# Patient Record
Sex: Female | Born: 1945 | Race: Black or African American | Hispanic: No | Smoking: Former smoker
Health system: Southern US, Community
[De-identification: ages and names within clinical notes are randomized; demographics above are authoritative.]

## PROBLEM LIST (undated history)

## (undated) DIAGNOSIS — E079 Disorder of thyroid, unspecified: Secondary | ICD-10-CM

## (undated) DIAGNOSIS — N189 Chronic kidney disease, unspecified: Secondary | ICD-10-CM

## (undated) DIAGNOSIS — A6000 Herpesviral infection of urogenital system, unspecified: Secondary | ICD-10-CM

## (undated) DIAGNOSIS — I10 Essential (primary) hypertension: Secondary | ICD-10-CM

## (undated) DIAGNOSIS — F32A Depression, unspecified: Secondary | ICD-10-CM

## (undated) DIAGNOSIS — E119 Type 2 diabetes mellitus without complications: Secondary | ICD-10-CM

## (undated) DIAGNOSIS — K219 Gastro-esophageal reflux disease without esophagitis: Secondary | ICD-10-CM

## (undated) DIAGNOSIS — E785 Hyperlipidemia, unspecified: Secondary | ICD-10-CM

## (undated) DIAGNOSIS — R011 Cardiac murmur, unspecified: Secondary | ICD-10-CM

## (undated) HISTORY — DX: Cardiac murmur, unspecified: R01.1

## (undated) HISTORY — DX: Depression, unspecified: F32.A

## (undated) HISTORY — DX: Disorder of thyroid, unspecified: E07.9

## (undated) HISTORY — DX: Hyperlipidemia, unspecified: E78.5

## (undated) HISTORY — DX: Herpesviral infection of urogenital system, unspecified: A60.00

## (undated) HISTORY — DX: Chronic kidney disease, unspecified: N18.9

## (undated) HISTORY — DX: Essential (primary) hypertension: I10

## (undated) HISTORY — DX: Gastro-esophageal reflux disease without esophagitis: K21.9

## (undated) HISTORY — DX: Type 2 diabetes mellitus without complications: E11.9

---

## 2016-07-07 DIAGNOSIS — Z1231 Encounter for screening mammogram for malignant neoplasm of breast: Secondary | ICD-10-CM | POA: Diagnosis not present

## 2016-08-02 DIAGNOSIS — E782 Mixed hyperlipidemia: Secondary | ICD-10-CM | POA: Diagnosis not present

## 2016-08-02 DIAGNOSIS — E119 Type 2 diabetes mellitus without complications: Secondary | ICD-10-CM | POA: Diagnosis not present

## 2016-08-02 DIAGNOSIS — E039 Hypothyroidism, unspecified: Secondary | ICD-10-CM | POA: Diagnosis not present

## 2016-08-02 DIAGNOSIS — I10 Essential (primary) hypertension: Secondary | ICD-10-CM | POA: Diagnosis not present

## 2016-09-02 DIAGNOSIS — I10 Essential (primary) hypertension: Secondary | ICD-10-CM | POA: Diagnosis not present

## 2016-09-29 DIAGNOSIS — H401132 Primary open-angle glaucoma, bilateral, moderate stage: Secondary | ICD-10-CM | POA: Diagnosis not present

## 2016-09-29 DIAGNOSIS — E119 Type 2 diabetes mellitus without complications: Secondary | ICD-10-CM | POA: Diagnosis not present

## 2017-01-03 DIAGNOSIS — N390 Urinary tract infection, site not specified: Secondary | ICD-10-CM | POA: Diagnosis not present

## 2017-01-03 DIAGNOSIS — N189 Chronic kidney disease, unspecified: Secondary | ICD-10-CM | POA: Diagnosis not present

## 2017-01-03 DIAGNOSIS — I10 Essential (primary) hypertension: Secondary | ICD-10-CM | POA: Diagnosis not present

## 2017-01-03 DIAGNOSIS — M549 Dorsalgia, unspecified: Secondary | ICD-10-CM | POA: Diagnosis not present

## 2017-02-03 DIAGNOSIS — N189 Chronic kidney disease, unspecified: Secondary | ICD-10-CM | POA: Diagnosis not present

## 2017-02-03 DIAGNOSIS — Z79899 Other long term (current) drug therapy: Secondary | ICD-10-CM | POA: Diagnosis not present

## 2017-02-03 DIAGNOSIS — Z23 Encounter for immunization: Secondary | ICD-10-CM | POA: Diagnosis not present

## 2017-02-03 DIAGNOSIS — Z131 Encounter for screening for diabetes mellitus: Secondary | ICD-10-CM | POA: Diagnosis not present

## 2017-02-03 DIAGNOSIS — Z0000 Encounter for general adult medical examination without abnormal findings: Secondary | ICD-10-CM | POA: Diagnosis not present

## 2017-02-03 DIAGNOSIS — E039 Hypothyroidism, unspecified: Secondary | ICD-10-CM | POA: Diagnosis not present

## 2017-02-03 DIAGNOSIS — E782 Mixed hyperlipidemia: Secondary | ICD-10-CM | POA: Diagnosis not present

## 2017-02-03 DIAGNOSIS — E119 Type 2 diabetes mellitus without complications: Secondary | ICD-10-CM | POA: Diagnosis not present

## 2017-02-03 DIAGNOSIS — I10 Essential (primary) hypertension: Secondary | ICD-10-CM | POA: Diagnosis not present

## 2017-04-04 DIAGNOSIS — H04122 Dry eye syndrome of left lacrimal gland: Secondary | ICD-10-CM | POA: Diagnosis not present

## 2017-04-04 DIAGNOSIS — H401132 Primary open-angle glaucoma, bilateral, moderate stage: Secondary | ICD-10-CM | POA: Diagnosis not present

## 2017-04-04 DIAGNOSIS — H04121 Dry eye syndrome of right lacrimal gland: Secondary | ICD-10-CM | POA: Diagnosis not present

## 2017-04-04 DIAGNOSIS — E119 Type 2 diabetes mellitus without complications: Secondary | ICD-10-CM | POA: Diagnosis not present

## 2017-04-04 DIAGNOSIS — H2513 Age-related nuclear cataract, bilateral: Secondary | ICD-10-CM | POA: Diagnosis not present

## 2017-04-04 DIAGNOSIS — H04123 Dry eye syndrome of bilateral lacrimal glands: Secondary | ICD-10-CM | POA: Diagnosis not present

## 2017-04-10 DIAGNOSIS — I10 Essential (primary) hypertension: Secondary | ICD-10-CM | POA: Diagnosis not present

## 2017-04-10 DIAGNOSIS — N189 Chronic kidney disease, unspecified: Secondary | ICD-10-CM | POA: Diagnosis not present

## 2017-04-10 DIAGNOSIS — E669 Obesity, unspecified: Secondary | ICD-10-CM | POA: Diagnosis not present

## 2017-04-10 DIAGNOSIS — G4733 Obstructive sleep apnea (adult) (pediatric): Secondary | ICD-10-CM | POA: Diagnosis not present

## 2017-07-10 DIAGNOSIS — Z1231 Encounter for screening mammogram for malignant neoplasm of breast: Secondary | ICD-10-CM | POA: Diagnosis not present

## 2017-08-02 DIAGNOSIS — E119 Type 2 diabetes mellitus without complications: Secondary | ICD-10-CM | POA: Diagnosis not present

## 2017-08-02 DIAGNOSIS — E039 Hypothyroidism, unspecified: Secondary | ICD-10-CM | POA: Diagnosis not present

## 2017-08-02 DIAGNOSIS — E782 Mixed hyperlipidemia: Secondary | ICD-10-CM | POA: Diagnosis not present

## 2017-08-02 DIAGNOSIS — I10 Essential (primary) hypertension: Secondary | ICD-10-CM | POA: Diagnosis not present

## 2017-10-03 DIAGNOSIS — H04121 Dry eye syndrome of right lacrimal gland: Secondary | ICD-10-CM | POA: Diagnosis not present

## 2017-10-03 DIAGNOSIS — E119 Type 2 diabetes mellitus without complications: Secondary | ICD-10-CM | POA: Diagnosis not present

## 2017-10-03 DIAGNOSIS — H04122 Dry eye syndrome of left lacrimal gland: Secondary | ICD-10-CM | POA: Diagnosis not present

## 2017-10-03 DIAGNOSIS — H2513 Age-related nuclear cataract, bilateral: Secondary | ICD-10-CM | POA: Diagnosis not present

## 2017-10-03 DIAGNOSIS — H04123 Dry eye syndrome of bilateral lacrimal glands: Secondary | ICD-10-CM | POA: Diagnosis not present

## 2017-10-03 DIAGNOSIS — H35033 Hypertensive retinopathy, bilateral: Secondary | ICD-10-CM | POA: Diagnosis not present

## 2017-10-03 DIAGNOSIS — H401132 Primary open-angle glaucoma, bilateral, moderate stage: Secondary | ICD-10-CM | POA: Diagnosis not present

## 2017-11-06 DIAGNOSIS — I10 Essential (primary) hypertension: Secondary | ICD-10-CM | POA: Diagnosis not present

## 2018-01-24 DIAGNOSIS — N183 Chronic kidney disease, stage 3 (moderate): Secondary | ICD-10-CM | POA: Diagnosis not present

## 2018-01-24 DIAGNOSIS — E785 Hyperlipidemia, unspecified: Secondary | ICD-10-CM | POA: Diagnosis not present

## 2018-01-24 DIAGNOSIS — Z23 Encounter for immunization: Secondary | ICD-10-CM | POA: Diagnosis not present

## 2018-01-24 DIAGNOSIS — E1169 Type 2 diabetes mellitus with other specified complication: Secondary | ICD-10-CM | POA: Diagnosis not present

## 2018-01-24 DIAGNOSIS — I10 Essential (primary) hypertension: Secondary | ICD-10-CM | POA: Diagnosis not present

## 2018-01-24 DIAGNOSIS — A6004 Herpesviral vulvovaginitis: Secondary | ICD-10-CM | POA: Diagnosis not present

## 2018-03-02 DIAGNOSIS — E785 Hyperlipidemia, unspecified: Secondary | ICD-10-CM | POA: Diagnosis not present

## 2018-03-02 DIAGNOSIS — I10 Essential (primary) hypertension: Secondary | ICD-10-CM | POA: Diagnosis not present

## 2018-03-02 DIAGNOSIS — N183 Chronic kidney disease, stage 3 (moderate): Secondary | ICD-10-CM | POA: Diagnosis not present

## 2018-03-02 DIAGNOSIS — Z780 Asymptomatic menopausal state: Secondary | ICD-10-CM | POA: Diagnosis not present

## 2018-03-02 DIAGNOSIS — L84 Corns and callosities: Secondary | ICD-10-CM | POA: Diagnosis not present

## 2018-03-02 DIAGNOSIS — Z0000 Encounter for general adult medical examination without abnormal findings: Secondary | ICD-10-CM | POA: Diagnosis not present

## 2018-03-02 DIAGNOSIS — Z1389 Encounter for screening for other disorder: Secondary | ICD-10-CM | POA: Diagnosis not present

## 2018-03-02 DIAGNOSIS — E1169 Type 2 diabetes mellitus with other specified complication: Secondary | ICD-10-CM | POA: Diagnosis not present

## 2018-04-23 DIAGNOSIS — G4733 Obstructive sleep apnea (adult) (pediatric): Secondary | ICD-10-CM | POA: Diagnosis not present

## 2018-04-27 DIAGNOSIS — E119 Type 2 diabetes mellitus without complications: Secondary | ICD-10-CM | POA: Diagnosis not present

## 2018-04-27 DIAGNOSIS — H524 Presbyopia: Secondary | ICD-10-CM | POA: Diagnosis not present

## 2018-06-08 DIAGNOSIS — L02416 Cutaneous abscess of left lower limb: Secondary | ICD-10-CM | POA: Diagnosis not present

## 2018-06-08 DIAGNOSIS — Z780 Asymptomatic menopausal state: Secondary | ICD-10-CM | POA: Diagnosis not present

## 2018-06-08 DIAGNOSIS — G4733 Obstructive sleep apnea (adult) (pediatric): Secondary | ICD-10-CM | POA: Diagnosis not present

## 2018-06-08 DIAGNOSIS — I10 Essential (primary) hypertension: Secondary | ICD-10-CM | POA: Diagnosis not present

## 2018-06-08 DIAGNOSIS — E1169 Type 2 diabetes mellitus with other specified complication: Secondary | ICD-10-CM | POA: Diagnosis not present

## 2018-06-08 DIAGNOSIS — L989 Disorder of the skin and subcutaneous tissue, unspecified: Secondary | ICD-10-CM | POA: Diagnosis not present

## 2018-06-08 DIAGNOSIS — Z1239 Encounter for other screening for malignant neoplasm of breast: Secondary | ICD-10-CM | POA: Diagnosis not present

## 2018-06-11 ENCOUNTER — Other Ambulatory Visit: Payer: Self-pay | Admitting: Internal Medicine

## 2018-06-11 DIAGNOSIS — Z1231 Encounter for screening mammogram for malignant neoplasm of breast: Secondary | ICD-10-CM

## 2018-07-13 ENCOUNTER — Inpatient Hospital Stay: Admission: RE | Admit: 2018-07-13 | Payer: Self-pay | Source: Ambulatory Visit

## 2018-07-16 DIAGNOSIS — E1169 Type 2 diabetes mellitus with other specified complication: Secondary | ICD-10-CM | POA: Diagnosis not present

## 2018-07-16 DIAGNOSIS — L237 Allergic contact dermatitis due to plants, except food: Secondary | ICD-10-CM | POA: Diagnosis not present

## 2018-07-16 DIAGNOSIS — I10 Essential (primary) hypertension: Secondary | ICD-10-CM | POA: Diagnosis not present

## 2018-08-14 ENCOUNTER — Ambulatory Visit: Payer: Self-pay

## 2018-09-11 DIAGNOSIS — N183 Chronic kidney disease, stage 3 (moderate): Secondary | ICD-10-CM | POA: Diagnosis not present

## 2018-09-11 DIAGNOSIS — I10 Essential (primary) hypertension: Secondary | ICD-10-CM | POA: Diagnosis not present

## 2018-09-11 DIAGNOSIS — E785 Hyperlipidemia, unspecified: Secondary | ICD-10-CM | POA: Diagnosis not present

## 2018-09-11 DIAGNOSIS — E1169 Type 2 diabetes mellitus with other specified complication: Secondary | ICD-10-CM | POA: Diagnosis not present

## 2018-09-20 DIAGNOSIS — E1169 Type 2 diabetes mellitus with other specified complication: Secondary | ICD-10-CM | POA: Diagnosis not present

## 2018-09-20 DIAGNOSIS — E785 Hyperlipidemia, unspecified: Secondary | ICD-10-CM | POA: Diagnosis not present

## 2018-09-20 DIAGNOSIS — A6004 Herpesviral vulvovaginitis: Secondary | ICD-10-CM | POA: Diagnosis not present

## 2018-09-20 DIAGNOSIS — N183 Chronic kidney disease, stage 3 (moderate): Secondary | ICD-10-CM | POA: Diagnosis not present

## 2018-09-20 DIAGNOSIS — I10 Essential (primary) hypertension: Secondary | ICD-10-CM | POA: Diagnosis not present

## 2018-09-26 ENCOUNTER — Ambulatory Visit: Payer: Self-pay

## 2018-10-05 DIAGNOSIS — W57XXXA Bitten or stung by nonvenomous insect and other nonvenomous arthropods, initial encounter: Secondary | ICD-10-CM | POA: Diagnosis not present

## 2018-10-05 DIAGNOSIS — S2096XA Insect bite (nonvenomous) of unspecified parts of thorax, initial encounter: Secondary | ICD-10-CM | POA: Diagnosis not present

## 2018-10-05 DIAGNOSIS — L209 Atopic dermatitis, unspecified: Secondary | ICD-10-CM | POA: Diagnosis not present

## 2018-10-08 DIAGNOSIS — E1169 Type 2 diabetes mellitus with other specified complication: Secondary | ICD-10-CM | POA: Diagnosis not present

## 2018-10-08 DIAGNOSIS — N183 Chronic kidney disease, stage 3 (moderate): Secondary | ICD-10-CM | POA: Diagnosis not present

## 2018-10-08 DIAGNOSIS — I10 Essential (primary) hypertension: Secondary | ICD-10-CM | POA: Diagnosis not present

## 2018-10-08 DIAGNOSIS — E785 Hyperlipidemia, unspecified: Secondary | ICD-10-CM | POA: Diagnosis not present

## 2018-10-08 DIAGNOSIS — E039 Hypothyroidism, unspecified: Secondary | ICD-10-CM | POA: Diagnosis not present

## 2018-11-05 ENCOUNTER — Ambulatory Visit
Admission: RE | Admit: 2018-11-05 | Discharge: 2018-11-05 | Disposition: A | Discharge status: Home / Self Care | Payer: Medicare Other | Source: Ambulatory Visit | Attending: Internal Medicine | Admitting: Internal Medicine

## 2018-11-05 ENCOUNTER — Other Ambulatory Visit: Payer: Self-pay

## 2018-11-05 DIAGNOSIS — Z1231 Encounter for screening mammogram for malignant neoplasm of breast: Secondary | ICD-10-CM | POA: Diagnosis not present

## 2018-11-23 DIAGNOSIS — E1169 Type 2 diabetes mellitus with other specified complication: Secondary | ICD-10-CM | POA: Diagnosis not present

## 2018-11-23 DIAGNOSIS — I10 Essential (primary) hypertension: Secondary | ICD-10-CM | POA: Diagnosis not present

## 2018-11-23 DIAGNOSIS — E039 Hypothyroidism, unspecified: Secondary | ICD-10-CM | POA: Diagnosis not present

## 2018-11-23 DIAGNOSIS — Z7984 Long term (current) use of oral hypoglycemic drugs: Secondary | ICD-10-CM | POA: Diagnosis not present

## 2018-11-23 DIAGNOSIS — E785 Hyperlipidemia, unspecified: Secondary | ICD-10-CM | POA: Diagnosis not present

## 2018-11-23 DIAGNOSIS — N183 Chronic kidney disease, stage 3 (moderate): Secondary | ICD-10-CM | POA: Diagnosis not present

## 2019-01-03 DIAGNOSIS — E1169 Type 2 diabetes mellitus with other specified complication: Secondary | ICD-10-CM | POA: Diagnosis not present

## 2019-01-03 DIAGNOSIS — E785 Hyperlipidemia, unspecified: Secondary | ICD-10-CM | POA: Diagnosis not present

## 2019-01-03 DIAGNOSIS — E039 Hypothyroidism, unspecified: Secondary | ICD-10-CM | POA: Diagnosis not present

## 2019-01-03 DIAGNOSIS — I10 Essential (primary) hypertension: Secondary | ICD-10-CM | POA: Diagnosis not present

## 2019-01-03 DIAGNOSIS — N183 Chronic kidney disease, stage 3 (moderate): Secondary | ICD-10-CM | POA: Diagnosis not present

## 2019-01-29 DIAGNOSIS — I10 Essential (primary) hypertension: Secondary | ICD-10-CM | POA: Diagnosis not present

## 2019-01-29 DIAGNOSIS — E039 Hypothyroidism, unspecified: Secondary | ICD-10-CM | POA: Diagnosis not present

## 2019-01-29 DIAGNOSIS — E1169 Type 2 diabetes mellitus with other specified complication: Secondary | ICD-10-CM | POA: Diagnosis not present

## 2019-01-29 DIAGNOSIS — E785 Hyperlipidemia, unspecified: Secondary | ICD-10-CM | POA: Diagnosis not present

## 2019-02-22 DIAGNOSIS — Z23 Encounter for immunization: Secondary | ICD-10-CM | POA: Diagnosis not present

## 2019-03-15 ENCOUNTER — Other Ambulatory Visit: Payer: Self-pay | Admitting: Internal Medicine

## 2019-03-15 DIAGNOSIS — E785 Hyperlipidemia, unspecified: Secondary | ICD-10-CM | POA: Diagnosis not present

## 2019-03-15 DIAGNOSIS — E2839 Other primary ovarian failure: Secondary | ICD-10-CM

## 2019-03-15 DIAGNOSIS — Z7984 Long term (current) use of oral hypoglycemic drugs: Secondary | ICD-10-CM | POA: Diagnosis not present

## 2019-03-15 DIAGNOSIS — Z23 Encounter for immunization: Secondary | ICD-10-CM | POA: Diagnosis not present

## 2019-03-15 DIAGNOSIS — Z1389 Encounter for screening for other disorder: Secondary | ICD-10-CM | POA: Diagnosis not present

## 2019-03-15 DIAGNOSIS — N1832 Chronic kidney disease, stage 3b: Secondary | ICD-10-CM | POA: Diagnosis not present

## 2019-03-15 DIAGNOSIS — Z0000 Encounter for general adult medical examination without abnormal findings: Secondary | ICD-10-CM | POA: Diagnosis not present

## 2019-03-15 DIAGNOSIS — E039 Hypothyroidism, unspecified: Secondary | ICD-10-CM | POA: Diagnosis not present

## 2019-03-15 DIAGNOSIS — Z780 Asymptomatic menopausal state: Secondary | ICD-10-CM | POA: Diagnosis not present

## 2019-03-15 DIAGNOSIS — E1169 Type 2 diabetes mellitus with other specified complication: Secondary | ICD-10-CM | POA: Diagnosis not present

## 2019-03-15 DIAGNOSIS — I10 Essential (primary) hypertension: Secondary | ICD-10-CM | POA: Diagnosis not present

## 2019-04-03 ENCOUNTER — Other Ambulatory Visit: Payer: Self-pay

## 2019-04-03 ENCOUNTER — Ambulatory Visit
Admission: RE | Admit: 2019-04-03 | Discharge: 2019-04-03 | Disposition: A | Discharge status: Home / Self Care | Payer: Medicare Other | Source: Ambulatory Visit | Attending: Internal Medicine | Admitting: Internal Medicine

## 2019-04-03 DIAGNOSIS — Z780 Asymptomatic menopausal state: Secondary | ICD-10-CM | POA: Diagnosis not present

## 2019-04-03 DIAGNOSIS — E2839 Other primary ovarian failure: Secondary | ICD-10-CM

## 2019-04-03 DIAGNOSIS — Z13820 Encounter for screening for osteoporosis: Secondary | ICD-10-CM | POA: Diagnosis not present

## 2019-05-22 ENCOUNTER — Ambulatory Visit (INDEPENDENT_AMBULATORY_CARE_PROVIDER_SITE_OTHER): Payer: Medicare Other | Admitting: Podiatry

## 2019-05-22 ENCOUNTER — Other Ambulatory Visit: Payer: Self-pay

## 2019-05-22 ENCOUNTER — Encounter: Payer: Self-pay | Admitting: Podiatry

## 2019-05-22 DIAGNOSIS — L84 Corns and callosities: Secondary | ICD-10-CM | POA: Diagnosis not present

## 2019-05-22 NOTE — Progress Notes (Signed)
This patient presents the office with chief complaint of painful calluses on both feet.  She says she is most concerned about the callus under her left big toe.  She says she also has had calluses on the outside ball of both feet for years.  She says she has utilize acid to treat the calluses on the outside bottom of both feet.  This patient is diabetic and presents the office for treatment of the callus and a diabetic foot exam.  Vascular  Dorsalis pedis and posterior tibial pulses are palpable  B/L.  Capillary return  WNL.  Temperature gradient is  WNL.  Skin turgor  WNL  Sensorium  Senn Weinstein monofilament wire  WNL. Normal tactile sensation.  Nail Exam  Patient has normal nails with no evidence of bacterial or fungal infection.  Orthopedic  Exam  Muscle tone and muscle strength  WNL.  No limitations of motion feet  B/L.  No crepitus or joint effusion noted.  Foot type is unremarkable and digits show no abnormalities.  Bony prominences are unremarkable. Plantar flex fifth metatarsal  B/L.  Skin  No open lesions.  Normal skin texture and turgor.  Callus left hallux.  Asymptomatic porokeratosis sub 5th met  B/L.  Callus  Left hallux.  IE.  Debride callus left hallux.  Discussed calluses sub 5th  B/L which are asymptomatic.  Patient has no evidence of any vascular or neurologic pathology.  RTC prn.   Gardiner Barefoot DPM

## 2019-08-02 DIAGNOSIS — E1169 Type 2 diabetes mellitus with other specified complication: Secondary | ICD-10-CM | POA: Diagnosis not present

## 2019-08-02 DIAGNOSIS — N183 Chronic kidney disease, stage 3 unspecified: Secondary | ICD-10-CM | POA: Diagnosis not present

## 2019-08-02 DIAGNOSIS — M7052 Other bursitis of knee, left knee: Secondary | ICD-10-CM | POA: Diagnosis not present

## 2019-09-13 DIAGNOSIS — E1169 Type 2 diabetes mellitus with other specified complication: Secondary | ICD-10-CM | POA: Diagnosis not present

## 2019-09-13 DIAGNOSIS — I10 Essential (primary) hypertension: Secondary | ICD-10-CM | POA: Diagnosis not present

## 2019-09-13 DIAGNOSIS — N183 Chronic kidney disease, stage 3 unspecified: Secondary | ICD-10-CM | POA: Diagnosis not present

## 2019-10-08 ENCOUNTER — Other Ambulatory Visit: Payer: Self-pay | Admitting: Internal Medicine

## 2019-10-08 DIAGNOSIS — Z1231 Encounter for screening mammogram for malignant neoplasm of breast: Secondary | ICD-10-CM

## 2019-11-07 ENCOUNTER — Other Ambulatory Visit: Payer: Self-pay

## 2019-11-07 ENCOUNTER — Ambulatory Visit
Admission: RE | Admit: 2019-11-07 | Discharge: 2019-11-07 | Disposition: A | Discharge status: Home / Self Care | Payer: Medicare Other | Source: Ambulatory Visit | Attending: Internal Medicine | Admitting: Internal Medicine

## 2019-11-07 DIAGNOSIS — Z1231 Encounter for screening mammogram for malignant neoplasm of breast: Secondary | ICD-10-CM | POA: Diagnosis not present

## 2019-11-12 ENCOUNTER — Other Ambulatory Visit: Payer: Self-pay | Admitting: Internal Medicine

## 2019-11-12 DIAGNOSIS — R928 Other abnormal and inconclusive findings on diagnostic imaging of breast: Secondary | ICD-10-CM

## 2019-11-15 ENCOUNTER — Other Ambulatory Visit: Payer: Self-pay

## 2019-11-15 ENCOUNTER — Ambulatory Visit
Admission: RE | Admit: 2019-11-15 | Discharge: 2019-11-15 | Disposition: A | Discharge status: Home / Self Care | Payer: Medicare Other | Source: Ambulatory Visit | Attending: Internal Medicine | Admitting: Internal Medicine

## 2019-11-15 DIAGNOSIS — N6489 Other specified disorders of breast: Secondary | ICD-10-CM | POA: Diagnosis not present

## 2019-11-15 DIAGNOSIS — R928 Other abnormal and inconclusive findings on diagnostic imaging of breast: Secondary | ICD-10-CM | POA: Diagnosis not present

## 2020-01-29 DIAGNOSIS — Z23 Encounter for immunization: Secondary | ICD-10-CM | POA: Diagnosis not present

## 2020-03-24 DIAGNOSIS — Z7984 Long term (current) use of oral hypoglycemic drugs: Secondary | ICD-10-CM | POA: Diagnosis not present

## 2020-03-24 DIAGNOSIS — Z7189 Other specified counseling: Secondary | ICD-10-CM | POA: Diagnosis not present

## 2020-03-24 DIAGNOSIS — G4733 Obstructive sleep apnea (adult) (pediatric): Secondary | ICD-10-CM | POA: Diagnosis not present

## 2020-03-24 DIAGNOSIS — Z0000 Encounter for general adult medical examination without abnormal findings: Secondary | ICD-10-CM | POA: Diagnosis not present

## 2020-03-24 DIAGNOSIS — N183 Chronic kidney disease, stage 3 unspecified: Secondary | ICD-10-CM | POA: Diagnosis not present

## 2020-03-24 DIAGNOSIS — E1169 Type 2 diabetes mellitus with other specified complication: Secondary | ICD-10-CM | POA: Diagnosis not present

## 2020-03-24 DIAGNOSIS — Z1389 Encounter for screening for other disorder: Secondary | ICD-10-CM | POA: Diagnosis not present

## 2020-03-24 DIAGNOSIS — E785 Hyperlipidemia, unspecified: Secondary | ICD-10-CM | POA: Diagnosis not present

## 2020-03-24 DIAGNOSIS — E039 Hypothyroidism, unspecified: Secondary | ICD-10-CM | POA: Diagnosis not present

## 2020-03-24 DIAGNOSIS — A6004 Herpesviral vulvovaginitis: Secondary | ICD-10-CM | POA: Diagnosis not present

## 2020-03-24 DIAGNOSIS — Z1211 Encounter for screening for malignant neoplasm of colon: Secondary | ICD-10-CM | POA: Diagnosis not present

## 2020-03-24 DIAGNOSIS — I10 Essential (primary) hypertension: Secondary | ICD-10-CM | POA: Diagnosis not present

## 2020-04-22 DIAGNOSIS — G4733 Obstructive sleep apnea (adult) (pediatric): Secondary | ICD-10-CM | POA: Diagnosis not present

## 2020-04-22 DIAGNOSIS — I10 Essential (primary) hypertension: Secondary | ICD-10-CM | POA: Diagnosis not present

## 2020-04-22 DIAGNOSIS — E1169 Type 2 diabetes mellitus with other specified complication: Secondary | ICD-10-CM | POA: Diagnosis not present

## 2020-05-01 DIAGNOSIS — H401131 Primary open-angle glaucoma, bilateral, mild stage: Secondary | ICD-10-CM | POA: Diagnosis not present

## 2020-05-01 DIAGNOSIS — E119 Type 2 diabetes mellitus without complications: Secondary | ICD-10-CM | POA: Diagnosis not present

## 2020-05-01 DIAGNOSIS — H5203 Hypermetropia, bilateral: Secondary | ICD-10-CM | POA: Diagnosis not present

## 2020-06-03 DIAGNOSIS — Z1211 Encounter for screening for malignant neoplasm of colon: Secondary | ICD-10-CM | POA: Diagnosis not present

## 2020-06-03 DIAGNOSIS — Z1212 Encounter for screening for malignant neoplasm of rectum: Secondary | ICD-10-CM | POA: Diagnosis not present

## 2020-06-17 DIAGNOSIS — Z20822 Contact with and (suspected) exposure to covid-19: Secondary | ICD-10-CM | POA: Diagnosis not present

## 2020-08-26 DIAGNOSIS — H10413 Chronic giant papillary conjunctivitis, bilateral: Secondary | ICD-10-CM | POA: Diagnosis not present

## 2020-08-26 DIAGNOSIS — H00012 Hordeolum externum right lower eyelid: Secondary | ICD-10-CM | POA: Diagnosis not present

## 2020-09-23 DIAGNOSIS — Z7984 Long term (current) use of oral hypoglycemic drugs: Secondary | ICD-10-CM | POA: Diagnosis not present

## 2020-09-23 DIAGNOSIS — I10 Essential (primary) hypertension: Secondary | ICD-10-CM | POA: Diagnosis not present

## 2020-09-23 DIAGNOSIS — E1169 Type 2 diabetes mellitus with other specified complication: Secondary | ICD-10-CM | POA: Diagnosis not present

## 2020-09-23 DIAGNOSIS — N1832 Chronic kidney disease, stage 3b: Secondary | ICD-10-CM | POA: Diagnosis not present

## 2020-10-14 ENCOUNTER — Other Ambulatory Visit: Payer: Self-pay | Admitting: Internal Medicine

## 2020-10-14 DIAGNOSIS — Z1231 Encounter for screening mammogram for malignant neoplasm of breast: Secondary | ICD-10-CM

## 2020-10-29 DIAGNOSIS — H401131 Primary open-angle glaucoma, bilateral, mild stage: Secondary | ICD-10-CM | POA: Diagnosis not present

## 2020-11-26 DIAGNOSIS — I129 Hypertensive chronic kidney disease with stage 1 through stage 4 chronic kidney disease, or unspecified chronic kidney disease: Secondary | ICD-10-CM | POA: Diagnosis not present

## 2020-11-26 DIAGNOSIS — E1122 Type 2 diabetes mellitus with diabetic chronic kidney disease: Secondary | ICD-10-CM | POA: Diagnosis not present

## 2020-11-26 DIAGNOSIS — A6000 Herpesviral infection of urogenital system, unspecified: Secondary | ICD-10-CM | POA: Diagnosis not present

## 2020-11-26 DIAGNOSIS — N184 Chronic kidney disease, stage 4 (severe): Secondary | ICD-10-CM | POA: Diagnosis not present

## 2020-11-27 ENCOUNTER — Other Ambulatory Visit: Payer: Self-pay | Admitting: Internal Medicine

## 2020-11-27 ENCOUNTER — Ambulatory Visit
Admission: RE | Admit: 2020-11-27 | Discharge: 2020-11-27 | Disposition: A | Discharge status: Home / Self Care | Payer: Medicare Other | Source: Ambulatory Visit | Attending: Internal Medicine | Admitting: Internal Medicine

## 2020-11-27 DIAGNOSIS — N189 Chronic kidney disease, unspecified: Secondary | ICD-10-CM | POA: Diagnosis not present

## 2020-11-27 DIAGNOSIS — N184 Chronic kidney disease, stage 4 (severe): Secondary | ICD-10-CM

## 2020-11-27 DIAGNOSIS — N281 Cyst of kidney, acquired: Secondary | ICD-10-CM | POA: Diagnosis not present

## 2020-11-30 DIAGNOSIS — B9689 Other specified bacterial agents as the cause of diseases classified elsewhere: Secondary | ICD-10-CM | POA: Diagnosis not present

## 2020-11-30 DIAGNOSIS — L0201 Cutaneous abscess of face: Secondary | ICD-10-CM | POA: Diagnosis not present

## 2020-11-30 DIAGNOSIS — I10 Essential (primary) hypertension: Secondary | ICD-10-CM | POA: Diagnosis not present

## 2020-12-07 ENCOUNTER — Ambulatory Visit
Admission: RE | Admit: 2020-12-07 | Discharge: 2020-12-07 | Disposition: A | Discharge status: Home / Self Care | Payer: Medicare Other | Source: Ambulatory Visit | Attending: Internal Medicine | Admitting: Internal Medicine

## 2020-12-07 ENCOUNTER — Other Ambulatory Visit: Payer: Self-pay

## 2020-12-07 DIAGNOSIS — Z1231 Encounter for screening mammogram for malignant neoplasm of breast: Secondary | ICD-10-CM | POA: Diagnosis not present

## 2021-01-06 DIAGNOSIS — Z23 Encounter for immunization: Secondary | ICD-10-CM | POA: Diagnosis not present

## 2021-03-16 DIAGNOSIS — L720 Epidermal cyst: Secondary | ICD-10-CM | POA: Diagnosis not present

## 2021-03-24 DIAGNOSIS — L720 Epidermal cyst: Secondary | ICD-10-CM | POA: Diagnosis not present

## 2021-03-26 DIAGNOSIS — Z0000 Encounter for general adult medical examination without abnormal findings: Secondary | ICD-10-CM | POA: Diagnosis not present

## 2021-03-26 DIAGNOSIS — I10 Essential (primary) hypertension: Secondary | ICD-10-CM | POA: Diagnosis not present

## 2021-03-26 DIAGNOSIS — N184 Chronic kidney disease, stage 4 (severe): Secondary | ICD-10-CM | POA: Diagnosis not present

## 2021-03-26 DIAGNOSIS — E785 Hyperlipidemia, unspecified: Secondary | ICD-10-CM | POA: Diagnosis not present

## 2021-03-26 DIAGNOSIS — E1122 Type 2 diabetes mellitus with diabetic chronic kidney disease: Secondary | ICD-10-CM | POA: Diagnosis not present

## 2021-03-26 DIAGNOSIS — Z1389 Encounter for screening for other disorder: Secondary | ICD-10-CM | POA: Diagnosis not present

## 2021-03-26 DIAGNOSIS — E039 Hypothyroidism, unspecified: Secondary | ICD-10-CM | POA: Diagnosis not present

## 2021-04-11 IMAGING — MG DIGITAL SCREENING BILAT W/ TOMO W/ CAD
8 series · 9 of 24 positions shown · non-contrast
Comparison: Previous exam(s).

CLINICAL DATA: Screening.

EXAM:
DIGITAL SCREENING BILATERAL MAMMOGRAM WITH TOMO AND CAD

[L MLO synth-2D]
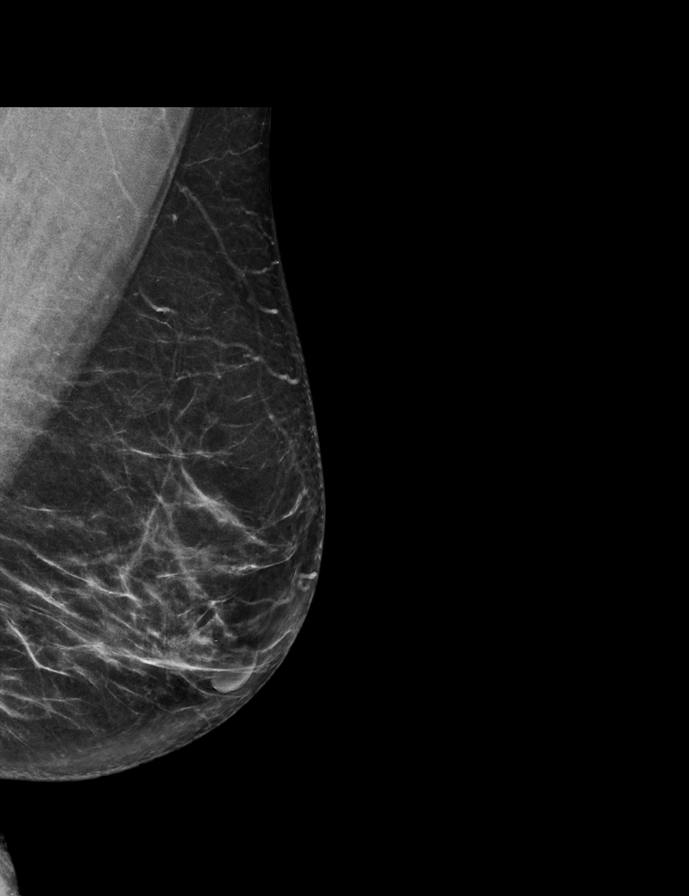

[R MLO synth-2D]
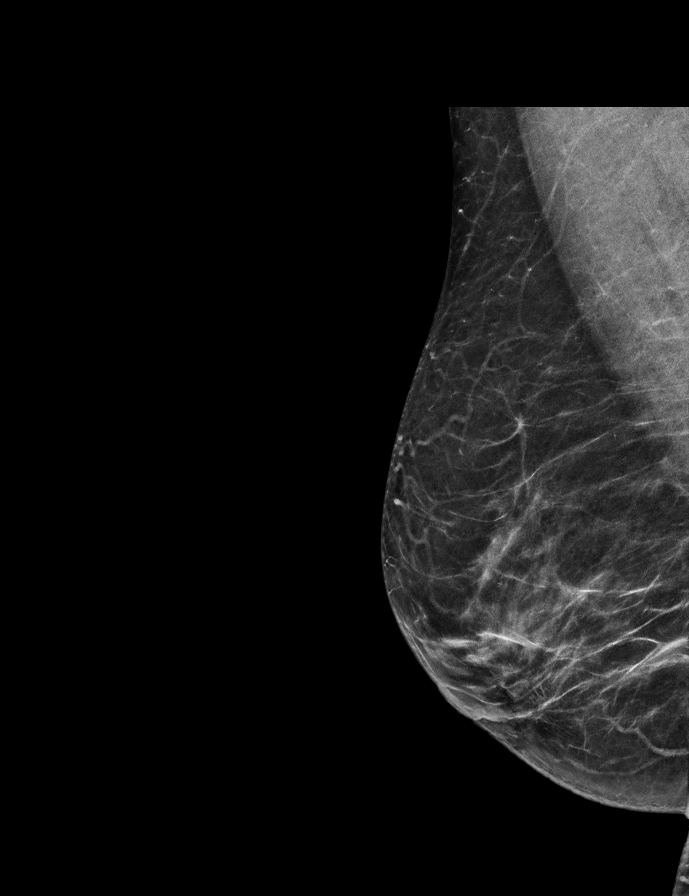

[L CC synth-2D]
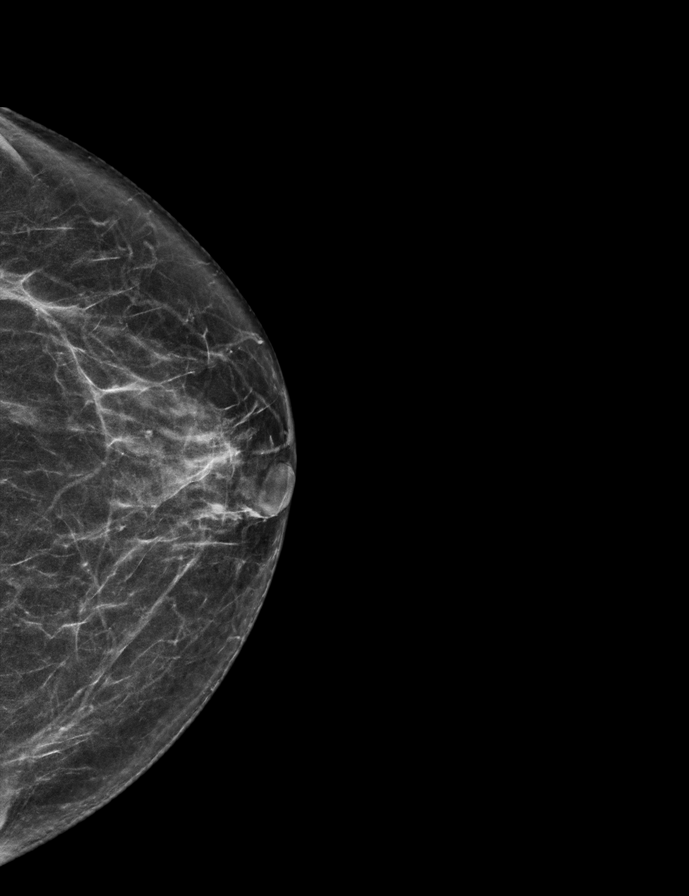

[R CC synth-2D]
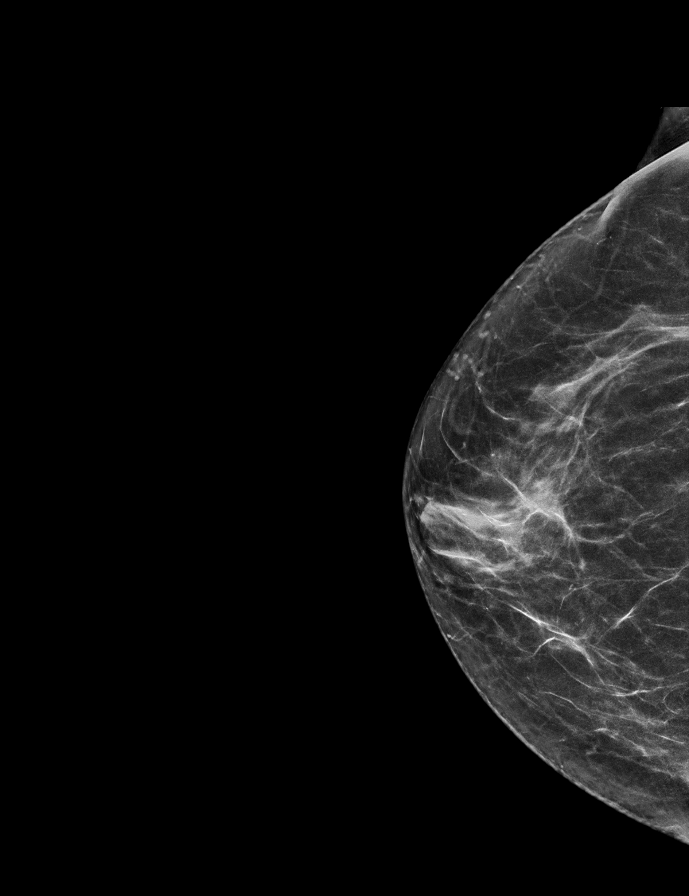

[L CC tomo · 2 of 57 frames shown]
[frame 19/57]
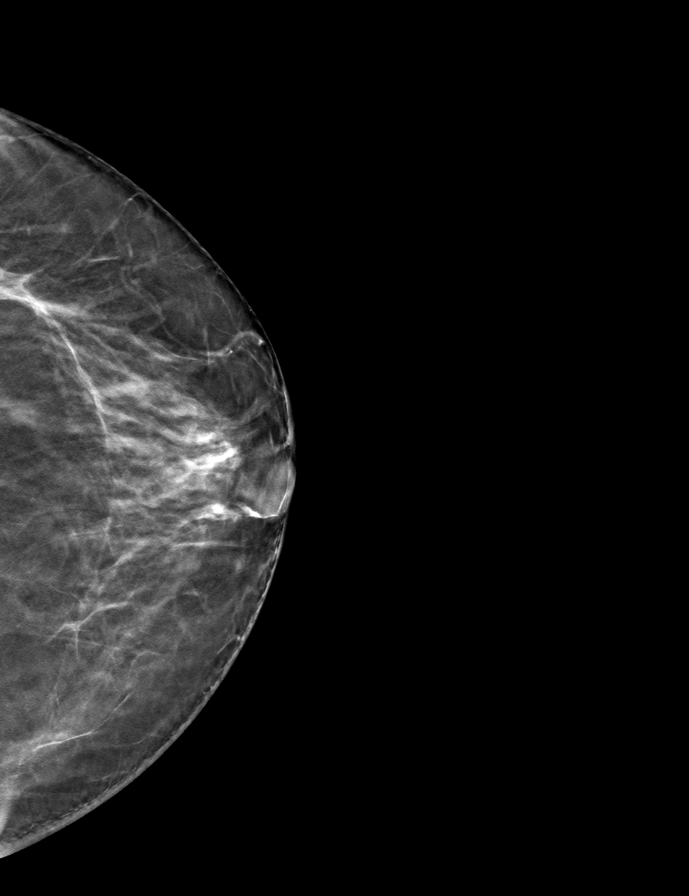
[frame 29/57]
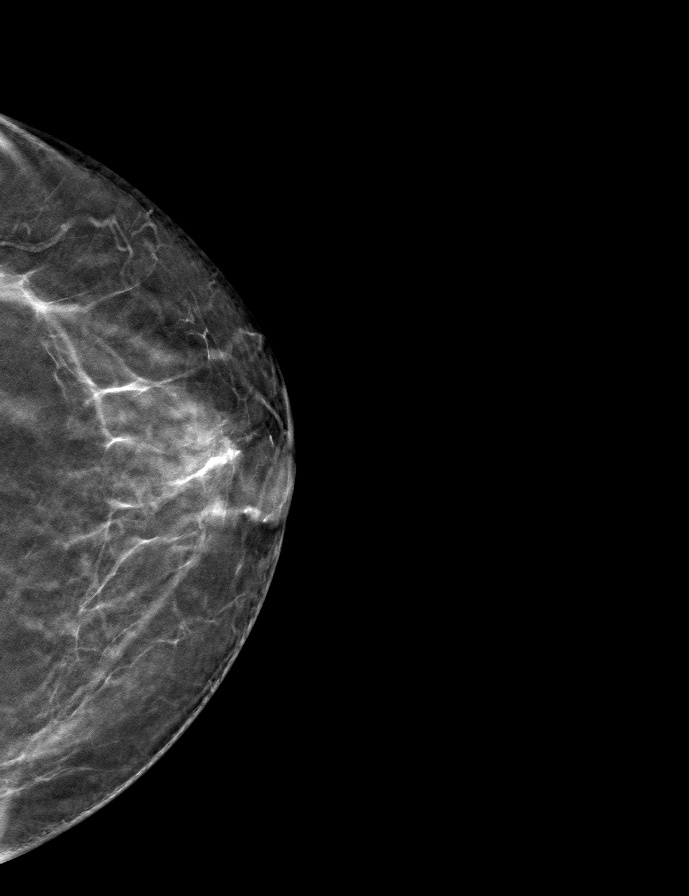

[R CC tomo · tomo slice 29/57.0]
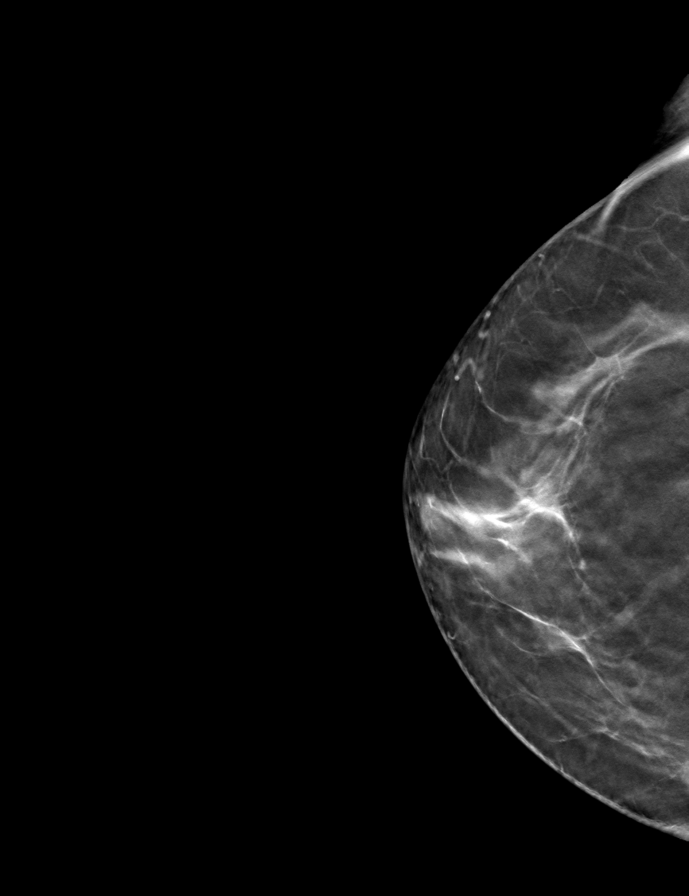

[R MLO tomo · tomo slice 30/59.0]
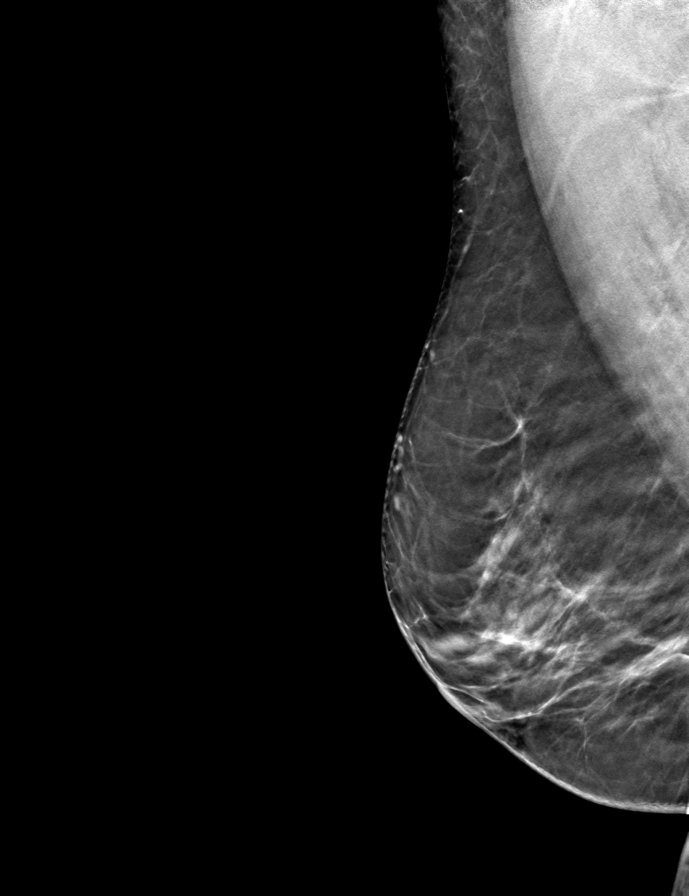

[L MLO tomo · tomo slice 33/66.0]
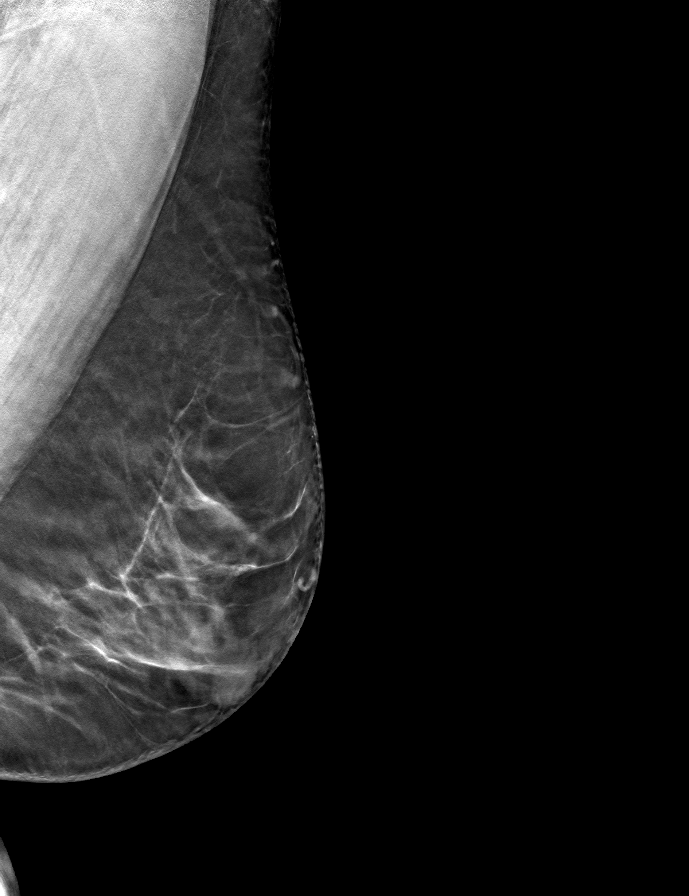

[9 of 24 positions shown; findings below may reference images not displayed]

ACR Breast Density Category b: There are scattered areas of
fibroglandular density.
FINDINGS: In the left breast, a possible asymmetry warrants further
evaluation. In the right breast, no findings suspicious for
malignancy. Images were processed with CAD.
IMPRESSION: Further evaluation is suggested for possible asymmetry in the left
breast.

RECOMMENDATION:
Diagnostic mammogram and possibly ultrasound of the left breast.
(Code:DI-5-LL4)

The patient will be contacted regarding the findings, and additional
imaging will be scheduled.

BI-RADS CATEGORY  0: Incomplete. Need additional imaging evaluation
and/or prior mammograms for comparison.

## 2021-04-21 DIAGNOSIS — G4733 Obstructive sleep apnea (adult) (pediatric): Secondary | ICD-10-CM | POA: Diagnosis not present

## 2021-05-04 DIAGNOSIS — H2513 Age-related nuclear cataract, bilateral: Secondary | ICD-10-CM | POA: Diagnosis not present

## 2021-05-04 DIAGNOSIS — H401131 Primary open-angle glaucoma, bilateral, mild stage: Secondary | ICD-10-CM | POA: Diagnosis not present

## 2021-05-04 DIAGNOSIS — H5203 Hypermetropia, bilateral: Secondary | ICD-10-CM | POA: Diagnosis not present

## 2021-05-04 DIAGNOSIS — E119 Type 2 diabetes mellitus without complications: Secondary | ICD-10-CM | POA: Diagnosis not present

## 2021-05-04 LAB — HM DIABETES EYE EXAM

## 2021-05-28 DIAGNOSIS — E1122 Type 2 diabetes mellitus with diabetic chronic kidney disease: Secondary | ICD-10-CM | POA: Diagnosis not present

## 2021-05-28 DIAGNOSIS — A6000 Herpesviral infection of urogenital system, unspecified: Secondary | ICD-10-CM | POA: Diagnosis not present

## 2021-05-28 DIAGNOSIS — N1832 Chronic kidney disease, stage 3b: Secondary | ICD-10-CM | POA: Diagnosis not present

## 2021-05-28 DIAGNOSIS — I129 Hypertensive chronic kidney disease with stage 1 through stage 4 chronic kidney disease, or unspecified chronic kidney disease: Secondary | ICD-10-CM | POA: Diagnosis not present

## 2021-05-28 DIAGNOSIS — D649 Anemia, unspecified: Secondary | ICD-10-CM | POA: Diagnosis not present

## 2021-05-28 DIAGNOSIS — N390 Urinary tract infection, site not specified: Secondary | ICD-10-CM | POA: Diagnosis not present

## 2021-08-25 ENCOUNTER — Encounter: Payer: Self-pay | Admitting: Emergency Medicine

## 2021-08-25 ENCOUNTER — Ambulatory Visit (INDEPENDENT_AMBULATORY_CARE_PROVIDER_SITE_OTHER): Payer: Medicare Other | Admitting: Emergency Medicine

## 2021-08-25 VITALS — BP 130/70 | HR 71 | Temp 98.3°F | Ht 68.0 in | Wt 209.4 lb

## 2021-08-25 DIAGNOSIS — Z7689 Persons encountering health services in other specified circumstances: Secondary | ICD-10-CM | POA: Diagnosis not present

## 2021-08-25 DIAGNOSIS — E785 Hyperlipidemia, unspecified: Secondary | ICD-10-CM

## 2021-08-25 DIAGNOSIS — G4733 Obstructive sleep apnea (adult) (pediatric): Secondary | ICD-10-CM

## 2021-08-25 DIAGNOSIS — E1122 Type 2 diabetes mellitus with diabetic chronic kidney disease: Secondary | ICD-10-CM | POA: Diagnosis not present

## 2021-08-25 DIAGNOSIS — I129 Hypertensive chronic kidney disease with stage 1 through stage 4 chronic kidney disease, or unspecified chronic kidney disease: Secondary | ICD-10-CM | POA: Diagnosis not present

## 2021-08-25 DIAGNOSIS — E1169 Type 2 diabetes mellitus with other specified complication: Secondary | ICD-10-CM

## 2021-08-25 DIAGNOSIS — I152 Hypertension secondary to endocrine disorders: Secondary | ICD-10-CM

## 2021-08-25 DIAGNOSIS — E1159 Type 2 diabetes mellitus with other circulatory complications: Secondary | ICD-10-CM

## 2021-08-25 DIAGNOSIS — N1832 Chronic kidney disease, stage 3b: Secondary | ICD-10-CM | POA: Diagnosis not present

## 2021-08-25 DIAGNOSIS — E039 Hypothyroidism, unspecified: Secondary | ICD-10-CM

## 2021-08-25 DIAGNOSIS — D649 Anemia, unspecified: Secondary | ICD-10-CM | POA: Diagnosis not present

## 2021-08-25 MED ORDER — GLIPIZIDE 5 MG PO TABS: 5.0000 mg | ORAL_TABLET | Freq: Every day | ORAL | 1 refills | Status: DC

## 2021-08-25 NOTE — Progress Notes (Signed)
Megan Gentry ?76 y.o. ? ? ?Chief Complaint  ?Patient presents with  ? New Patient (Initial Visit)  ?  No concerns ?  ? ? ?HISTORY OF PRESENT ILLNESS: ?This is a 76 y.o. female first visit to this office, wants to establish care with me. ?Past medical history includes hypertension, diabetes, chronic kidney disease, dyslipidemia and hypothyroidism ?Has no complaints or medical concerns today. ?Sees nephrologist on a regular basis. ?Also has history of obstructive sleep apnea.  Requesting referral to sleep center. ? ? ?HPI ? ? ?Prior to Admission medications   ?Medication Sig Start Date End Date Taking? Authorizing Provider  ?amLODipine (NORVASC) 10 MG tablet  03/27/19  Yes [provider]  ?fluticasone (FLONASE) 50 MCG/ACT nasal spray  05/02/19  Yes [provider]  ?glipiZIDE (GLUCOTROL) 5 MG tablet Take by mouth daily before breakfast.   Yes [provider]  ?hydrALAZINE (APRESOLINE) 50 MG tablet  03/25/19  Yes [provider]  ?latanoprost (XALATAN) 0.005 % ophthalmic solution  04/15/19  Yes [provider]  ?levothyroxine (SYNTHROID) 75 MCG tablet  03/25/19  Yes [provider]  ?metoprolol succinate (TOPROL-XL) 100 MG 24 hr tablet  04/19/19  Yes [provider]  ?Multiple Vitamin (MULTIVITAMIN) capsule Take 1 capsule by mouth daily.   Yes [provider]  ?olmesartan-hydrochlorothiazide (BENICAR HCT) 20-12.5 MG tablet  05/03/19  Yes [provider]  ?pramoxine-hydrocortisone (PROCTOCREAM-HC) 1-1 % rectal cream Place 1 application rectally 2 (two) times daily. 2.5%   Yes [provider]  ?simvastatin (ZOCOR) 40 MG tablet  03/27/19  Yes [provider]  ?spironolactone (ALDACTONE) 25 MG tablet  03/25/19  Yes [provider]  ?valACYclovir (VALTREX) 1000 MG tablet Take 1,000 mg by mouth 2 (two) times daily. prn   Yes [provider]  ?aspirin EC 81 MG tablet Take 81 mg by mouth daily. ?Patient not  taking: Reported on 08/25/2021    [provider]  ?temazepam (RESTORIL) 30 MG capsule Take 30 mg by mouth at bedtime as needed for sleep.    [provider]  ? ? ?Allergies  ?Allergen Reactions  ? Pyridium [Phenazopyridine Hcl] Other (See Comments)  ?  Severe lower back pain   ? Poison Ivy Extract Other (See Comments)  ?  Swelling of face and eyes  ? ? ?Patient Active Problem List  ? Diagnosis Date Noted  ? Corn or callus 05/22/2019  ? ? ?No past medical history on file. ? ?No past surgical history on file. ? ?Social History  ? ?Socioeconomic History  ? Marital status: Divorced  ?  Spouse name: Not on file  ? Number of children: Not on file  ? Years of education: Not on file  ? Highest education level: Not on file  ?Occupational History  ? Not on file  ?Tobacco Use  ? Smoking status: Never  ? Smokeless tobacco: Never  ?Substance and Sexual Activity  ? Alcohol use: Never  ? Drug use: Never  ? Sexual activity: Not on file  ?Other Topics Concern  ? Not on file  ?Social History Narrative  ? Not on file  ? ?Social Determinants of Health  ? ?Financial Resource Strain: Not on file  ?Food Insecurity: Not on file  ?Transportation Needs: Not on file  ?Physical Activity: Not on file  ?Stress: Not on file  ?Social Connections: Not on file  ?Intimate Partner Violence: Not on file  ? ? ?No family history on file. ? ? ?Review of Systems  ?Constitutional:  Negative.  Negative for chills and fever.  ?HENT: Negative.  Negative for congestion and sore throat.   ?Respiratory: Negative.  Negative for cough and shortness of breath.   ?Cardiovascular: Negative.  Negative for chest pain and palpitations.  ?Gastrointestinal: Negative.  Negative for abdominal pain, nausea and vomiting.  ?Genitourinary: Negative.   ?Skin: Negative.  Negative for rash.  ?Neurological:  Negative for dizziness and headaches.  ?All other systems reviewed and are negative. ? ?Today's Vitals  ? 08/25/21 1054 08/25/21 1105  ?BP: (!) 148/76 (!) 144/74   ?Pulse: 71   ?Temp: 98.3 ?F (36.8 ?C)   ?TempSrc: Oral   ?SpO2: 96%   ?Weight: 209 lb 6 oz (95 kg)   ?Height: 5\' 8"  (1.727 m)   ? ?Body mass index is 31.84 kg/m?. ? ?Physical Exam ?Vitals reviewed.  ?Constitutional:   ?   Appearance: Normal appearance.  ?HENT:  ?   Head: Normocephalic.  ?Eyes:  ?   Extraocular Movements: Extraocular movements intact.  ?   Pupils: Pupils are equal, round, and reactive to light.  ?Cardiovascular:  ?   Rate and Rhythm: Normal rate and regular rhythm.  ?   Pulses: Normal pulses.  ?   Heart sounds: Normal heart sounds.  ?Pulmonary:  ?   Effort: Pulmonary effort is normal.  ?   Breath sounds: Normal breath sounds.  ?Musculoskeletal:  ?   Cervical back: No tenderness.  ?   Right lower leg: No edema.  ?   Left lower leg: No edema.  ?Lymphadenopathy:  ?   Cervical: No cervical adenopathy.  ?Skin: ?   General: Skin is warm and dry.  ?   Capillary Refill: Capillary refill takes less than 2 seconds.  ?Neurological:  ?   General: No focal deficit present.  ?   Mental Status: She is alert and oriented to person, place, and time.  ?Psychiatric:     ?   Mood and Affect: Mood normal.     ?   Behavior: Behavior normal.  ? ? ? ?ASSESSMENT & PLAN: ?A total of 62 minutes was spent with the patient and counseling/coordination of care regarding preparing for this visit, review of available medical records, review of multiple chronic medical problems and their management, review of all medications, review of most recent blood work results, cardiovascular risks associated with hypertension and diabetes, education on nutrition, chronic kidney disease and need to stay well-hydrated and avoid NSAIDs, review of health maintenance items, prognosis, documentation and need for follow-up. ? ?Problem List Items Addressed This Visit   ? ?  ? Cardiovascular and Mediastinum  ? Parenchymal renal hypertension  ?  Stable.  Advised to stay well-hydrated and avoid NSAIDs ?Sees nephrologist on a regular basis. ? ?  ?  ?  Hypertension associated with diabetes ([place]) - Primary  ?  Mild elevation of blood pressure readings in the office.  Normal blood pressure readings at home.  Continue Benicar 20-12.5 mg daily, metoprolol succinate 100 mg daily, Aldactone 25 mg daily and amlodipine 10 mg daily. ?Stable diabetes.  Does not want blood work done today. ?No longer taking metformin.  On glipizide 5 mg daily ?Diet and nutrition discussed. ?Cardiovascular risk associated with hypertension and diabetes discussed. ? ? ?  ?  ? Relevant Medications  ? glipiZIDE (GLUCOTROL) 5 MG tablet  ?  ? Respiratory  ? Obstructive sleep apnea  ?  Requesting referral to sleep center. ?Referral placed today. ? ?  ?  ? Relevant Orders  ?  Ambulatory referral to Neurology  ?  ? Endocrine  ? Dyslipidemia associated with type 2 diabetes mellitus ([place])  ?  Stable.  Continue simvastatin 40 mg daily.  Diet and nutrition discussed. ? ?  ?  ? Relevant Medications  ? glipiZIDE (GLUCOTROL) 5 MG tablet  ? Hypothyroidism  ?  Clinically euthyroid.  Continue Synthroid 75 mcg daily. ? ? ?  ?  ?  ? Other  ? Anemia  ? ?Other Visit Diagnoses   ? ? Encounter to establish care      ? ?  ? ?Patient Instructions  ?Hypertension, Adult ?High blood pressure (hypertension) is when the force of blood pumping through the arteries is too strong. The arteries are the blood vessels that carry blood from the heart throughout the body. Hypertension forces the heart to work harder to pump blood and may cause arteries to become narrow or stiff. Untreated or uncontrolled hypertension can lead to a heart attack, heart failure, a stroke, kidney disease, and other problems. ?A blood pressure reading consists of a higher number over a lower number. Ideally, your blood pressure should be below 120/80. The first ("top") number is called the systolic pressure. It is a measure of the pressure in your arteries as your heart beats. The second ("bottom") number is called the diastolic pressure. It is a  measure of the pressure in your arteries as the heart relaxes. ?What are the causes? ?The exact cause of this condition is not known. There are some conditions that result in high blood pressure. ?What increases

## 2021-08-25 NOTE — Assessment & Plan Note (Addendum)
Mild elevation of blood pressure readings in the office.  Normal blood pressure readings at home.  Continue Benicar 20-12.5 mg daily, metoprolol succinate 100 mg daily, Aldactone 25 mg daily and amlodipine 10 mg daily. ?Stable diabetes.  Does not want blood work done today. ?No longer taking metformin.  On glipizide 5 mg daily ?Diet and nutrition discussed. ?Cardiovascular risk associated with hypertension and diabetes discussed. ? ?

## 2021-08-25 NOTE — Patient Instructions (Signed)
Hypertension, Adult High blood pressure (hypertension) is when the force of blood pumping through the arteries is too strong. The arteries are the blood vessels that carry blood from the heart throughout the body. Hypertension forces the heart to work harder to pump blood and may cause arteries to become narrow or stiff. Untreated or uncontrolled hypertension can lead to a heart attack, heart failure, a stroke, kidney disease, and other problems. A blood pressure reading consists of a higher number over a lower number. Ideally, your blood pressure should be below 120/80. The first ("top") number is called the systolic pressure. It is a measure of the pressure in your arteries as your heart beats. The second ("bottom") number is called the diastolic pressure. It is a measure of the pressure in your arteries as the heart relaxes. What are the causes? The exact cause of this condition is not known. There are some conditions that result in high blood pressure. What increases the risk? Certain factors may make you more likely to develop high blood pressure. Some of these risk factors are under your control, including: Smoking. Not getting enough exercise or physical activity. Being overweight. Having too much fat, sugar, calories, or salt (sodium) in your diet. Drinking too much alcohol. Other risk factors include: Having a personal history of heart disease, diabetes, high cholesterol, or kidney disease. Stress. Having a family history of high blood pressure and high cholesterol. Having obstructive sleep apnea. Age. The risk increases with age. What are the signs or symptoms? High blood pressure may not cause symptoms. Very high blood pressure (hypertensive crisis) may cause: Headache. Fast or irregular heartbeats (palpitations). Shortness of breath. Nosebleed. Nausea and vomiting. Vision changes. Severe chest pain, dizziness, and seizures. How is this diagnosed? This condition is diagnosed by  measuring your blood pressure while you are seated, with your arm resting on a flat surface, your legs uncrossed, and your feet flat on the floor. The cuff of the blood pressure monitor will be placed directly against the skin of your upper arm at the level of your heart. Blood pressure should be measured at least twice using the same arm. Certain conditions can cause a difference in blood pressure between your right and left arms. If you have a high blood pressure reading during one visit or you have normal blood pressure with other risk factors, you may be asked to: Return on a different day to have your blood pressure checked again. Monitor your blood pressure at home for 1 week or longer. If you are diagnosed with hypertension, you may have other blood or imaging tests to help your health care provider understand your overall risk for other conditions. How is this treated? This condition is treated by making healthy lifestyle changes, such as eating healthy foods, exercising more, and reducing your alcohol intake. You may be referred for counseling on a healthy diet and physical activity. Your health care provider may prescribe medicine if lifestyle changes are not enough to get your blood pressure under control and if: Your systolic blood pressure is above 130. Your diastolic blood pressure is above 80. Your personal target blood pressure may vary depending on your medical conditions, your age, and other factors. Follow these instructions at home: Eating and drinking  Eat a diet that is high in fiber and potassium, and low in sodium, added sugar, and fat. An example of this eating plan is called the DASH diet. DASH stands for Dietary Approaches to Stop Hypertension. To eat this way: Eat   plenty of fresh fruits and vegetables. Try to fill one half of your plate at each meal with fruits and vegetables. Eat whole grains, such as whole-wheat pasta, brown rice, or whole-grain bread. Fill about one  fourth of your plate with whole grains. Eat or drink low-fat dairy products, such as skim milk or low-fat yogurt. Avoid fatty cuts of meat, processed or cured meats, and poultry with skin. Fill about one fourth of your plate with lean proteins, such as fish, chicken without skin, beans, eggs, or tofu. Avoid pre-made and processed foods. These tend to be higher in sodium, added sugar, and fat. Reduce your daily sodium intake. Many people with hypertension should eat less than 1,500 mg of sodium a day. Do not drink alcohol if: Your health care provider tells you not to drink. You are pregnant, may be pregnant, or are planning to become pregnant. If you drink alcohol: Limit how much you have to: 0-1 drink a day for women. 0-2 drinks a day for men. Know how much alcohol is in your drink. In the [place] one drink equals one 12 oz bottle of beer (355 mL), one 5 oz glass of wine (148 mL), or one 1 oz glass of hard liquor (44 mL). Lifestyle  Work with your health care provider to maintain a healthy body weight or to lose weight. Ask what an ideal weight is for you. Get at least 30 minutes of exercise that causes your heart to beat faster (aerobic exercise) most days of the week. Activities may include walking, swimming, or biking. Include exercise to strengthen your muscles (resistance exercise), such as Pilates or lifting weights, as part of your weekly exercise routine. Try to do these types of exercises for 30 minutes at least 3 days a week. Do not use any products that contain nicotine or tobacco. These products include cigarettes, chewing tobacco, and vaping devices, such as e-cigarettes. If you need help quitting, ask your health care provider. Monitor your blood pressure at home as told by your health care provider. Keep all follow-up visits. This is important. Medicines Take over-the-counter and prescription medicines only as told by your health care provider. Follow directions carefully. Blood  pressure medicines must be taken as prescribed. Do not skip doses of blood pressure medicine. Doing this puts you at risk for problems and can make the medicine less effective. Ask your health care provider about side effects or reactions to medicines that you should watch for. Contact a health care provider if you: Think you are having a reaction to a medicine you are taking. Have headaches that keep coming back (recurring). Feel dizzy. Have swelling in your ankles. Have trouble with your vision. Get help right away if you: Develop a severe headache or confusion. Have unusual weakness or numbness. Feel faint. Have severe pain in your chest or abdomen. Vomit repeatedly. Have trouble breathing. These symptoms may be an emergency. Get help right away. Call 911. Do not wait to see if the symptoms will go away. Do not drive yourself to the hospital. Summary Hypertension is when the force of blood pumping through your arteries is too strong. If this condition is not controlled, it may put you at risk for serious complications. Your personal target blood pressure may vary depending on your medical conditions, your age, and other factors. For most people, a normal blood pressure is less than 120/80. Hypertension is treated with lifestyle changes, medicines, or a combination of both. Lifestyle changes include losing weight, eating a healthy,   low-sodium diet, exercising more, and limiting alcohol. This information is not intended to replace advice given to you by your health care provider. Make sure you discuss any questions you have with your health care provider. Document Revised: 02/16/2021 Document Reviewed: 02/16/2021 Elsevier Patient Education  2023 [employer]  

## 2021-08-25 NOTE — Assessment & Plan Note (Signed)
Clinically euthyroid.  Continue Synthroid 75 mcg daily. 

## 2021-08-25 NOTE — Assessment & Plan Note (Signed)
Requesting referral to sleep center. ?Referral placed today. ?

## 2021-08-25 NOTE — Assessment & Plan Note (Addendum)
Stable.  Advised to stay well-hydrated and avoid NSAIDs ?Sees nephrologist on a regular basis. ?

## 2021-08-25 NOTE — Assessment & Plan Note (Signed)
Stable.  Continue simvastatin 40 mg daily.  Diet and nutrition discussed. ?

## 2021-08-26 DIAGNOSIS — I129 Hypertensive chronic kidney disease with stage 1 through stage 4 chronic kidney disease, or unspecified chronic kidney disease: Secondary | ICD-10-CM | POA: Diagnosis not present

## 2021-08-26 DIAGNOSIS — N1832 Chronic kidney disease, stage 3b: Secondary | ICD-10-CM | POA: Diagnosis not present

## 2021-08-26 DIAGNOSIS — E1122 Type 2 diabetes mellitus with diabetic chronic kidney disease: Secondary | ICD-10-CM | POA: Diagnosis not present

## 2021-08-26 DIAGNOSIS — D649 Anemia, unspecified: Secondary | ICD-10-CM | POA: Diagnosis not present

## 2021-08-26 DIAGNOSIS — A6000 Herpesviral infection of urogenital system, unspecified: Secondary | ICD-10-CM | POA: Diagnosis not present

## 2021-08-29 ENCOUNTER — Encounter: Payer: Self-pay | Admitting: Emergency Medicine

## 2021-08-31 NOTE — Telephone Encounter (Signed)
Thank you for the info.

## 2021-09-01 NOTE — Telephone Encounter (Signed)
We will do what ever is missing, if anything from the other visit. ?No problems, we will accommodate the patient regarding blood tests.

## 2021-10-20 ENCOUNTER — Institutional Professional Consult (permissible substitution): Payer: Medicare Other | Admitting: Neurology

## 2021-10-27 ENCOUNTER — Institutional Professional Consult (permissible substitution): Payer: Medicare Other | Admitting: Neurology

## 2021-10-27 ENCOUNTER — Telehealth: Payer: Self-pay | Admitting: Neurology

## 2021-10-27 NOTE — Telephone Encounter (Signed)
Rescheduled 7/5 appointment with pt over the phone - MD out 

## 2021-11-03 DIAGNOSIS — H04123 Dry eye syndrome of bilateral lacrimal glands: Secondary | ICD-10-CM | POA: Diagnosis not present

## 2021-11-03 DIAGNOSIS — H401131 Primary open-angle glaucoma, bilateral, mild stage: Secondary | ICD-10-CM | POA: Diagnosis not present

## 2021-11-03 DIAGNOSIS — E119 Type 2 diabetes mellitus without complications: Secondary | ICD-10-CM | POA: Diagnosis not present

## 2021-11-03 DIAGNOSIS — H25813 Combined forms of age-related cataract, bilateral: Secondary | ICD-10-CM | POA: Diagnosis not present

## 2021-11-03 LAB — HM DIABETES EYE EXAM

## 2021-11-23 ENCOUNTER — Ambulatory Visit (INDEPENDENT_AMBULATORY_CARE_PROVIDER_SITE_OTHER): Payer: Medicare Other | Admitting: Neurology

## 2021-11-23 ENCOUNTER — Encounter: Payer: Self-pay | Admitting: Neurology

## 2021-11-23 VITALS — BP 137/71 | HR 65 | Ht 67.0 in | Wt 211.0 lb

## 2021-11-23 DIAGNOSIS — G4733 Obstructive sleep apnea (adult) (pediatric): Secondary | ICD-10-CM

## 2021-11-23 DIAGNOSIS — Z9989 Dependence on other enabling machines and devices: Secondary | ICD-10-CM | POA: Diagnosis not present

## 2021-11-23 DIAGNOSIS — E669 Obesity, unspecified: Secondary | ICD-10-CM

## 2021-11-23 DIAGNOSIS — R351 Nocturia: Secondary | ICD-10-CM

## 2021-11-23 NOTE — Patient Instructions (Addendum)
It was nice to meet you today.  You are compliant with your CPAP and your apnea is under good control.   Please continue using your CPAP regularly. While your insurance requires that you use CPAP at least 4 hours each night on 70% of the nights, I recommend, that you not skip any nights and use it throughout the night if you can. Getting used to CPAP and staying with the treatment long term does take time and patience and discipline. Untreated obstructive sleep apnea when it is moderate to severe can have an adverse impact on cardiovascular health and raise her risk for heart disease, arrhythmias, hypertension, congestive heart failure, stroke and diabetes. Untreated obstructive sleep apnea causes sleep disruption, nonrestorative sleep, and sleep deprivation. This can have an impact on your day to day functioning and cause daytime sleepiness and impairment of cognitive function, memory loss, mood disturbance, and problems focussing. Using CPAP regularly can improve these symptoms. I will place an order for supplies and we will try to get you established with a different company called [facility].  Please follow-up routinely in 1 year.  We may proceed with a home sleep test next year to be able to qualify you for new machine.

## 2021-11-23 NOTE — Progress Notes (Addendum)
Subjective:    Patient ID: Megan Gentry is a 76 y.o. female.  HPI    Star Age, MD, PhD [facility] [address], [address] [place], [place] [address]  Dear Dr. Mitchel Honour,  I saw your patient, Megan Gentry, upon your kind request in my sleep clinic today for initial consultation of her sleep disorder, in particular, evaluation of her prior diagnosis of obstructive sleep apnea.  The patient is unaccompanied today.  As you know, [name] is a 76 year old right-handed woman with an underlying medical history of hypertension, hyperlipidemia, thyroid disease, reflux disease, diabetes, chronic kidney disease, depression, heart murmur, and mild obesity, who was previously diagnosed with obstructive sleep apnea and placed on PAP therapy.  I reviewed your office note from 08/25/2021.  Prior sleep study results are not available for my review today.  Testing was in the [place], about 5 years ago.  She originally was diagnosed with sleep apnea in 2012 and in 2018 she got her second machine which she has a ResMed air sense 10 machine.  I was able to review her compliance data for the past 3 months, she used her machine 79 out of 90 days with percent use days greater than 4 hours at 88%, indicating very good compliance with an average usage of 7 hours and 22 minutes, residual AHI at goal at 1.2/h, average pressure for the 95th percentile at 8.9 cm with a range of 5 to 15 cm with EPR.  Leak on the higher side with the 95th percentile at 20.1 L/min.  She reports that she has benefited from treatment over time.  She is compliant with treatment and moved from the [place] area where she stayed with her son to [place].  She wants to be in between her kids.  She has a daughter in [place].  Patient is originally from [place] but went to college in [place].  Her daughter has 7 children and her son has 6 children and the patient has 15 great-grandchildren.  She has her  sister who is 74 years old with early dementia with her, sister moved from [place].  Patient is the youngest of 5 kids, she is 2 sisters and had 2 brothers, both brothers are deceased.  The patient is divorced, she has no pets in the household, she goes to bed around 11:30 PM and rise time is around 7:30 AM.  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  She has had some weight fluctuation within 5 to 10 pounds.  She drinks caffeine all of coffee, typically 1 cup and it is typically decaf.  She drinks alcohol very occasionally, quit smoking in 1991.  Her DME company is [employer] but she would like to switch to a different supplier.  She has been using a so clean machine.  She uses a nasal mask.  She is up-to-date with her supplies currently.  Her Epworth sleepiness score is 9 out of 24, fatigue severity score is 13 out of 63.  She has nocturia about once or twice per average night, she denies recurrent morning headaches.  Addendum, 01/19/2022: I received her sleep study report from [facility].  She had a sleep study on 01/23/2016.  Sleep latency was 11 minutes, sleep efficiency 81.5%, REM latency 77.5 minutes.  Wake after sleep onset was 73 minutes, she had an increased percentage of stage II sleep at 66.1%.  Total AHI was 8.8/h, REM AHI 23.2/h,  supine AHI 13.3/h.  She had no significant PLM's.  Average oxygen saturation was 89% for mean oxygen, minimum was 82, time below 88% saturation was 31.2 minutes.  Her Past Medical History Is Significant For: Past Medical History:  Diagnosis Date   Chronic kidney disease    Depression    Diabetes mellitus without complication (HCC)    Genital herpes    GERD (gastroesophageal reflux disease)    Heart murmur    Hyperlipidemia    Hypertension    Thyroid disease     Her Past Surgical History Is Significant For: History reviewed. No pertinent surgical history.  Her Family History Is Significant  For: Family History  Problem Relation Age of Onset   Stroke Mother    Hypertension Mother    Heart disease Mother    Heart disease Father    Alcohol abuse Father    Intellectual disability Sister    Hypertension Sister    Heart disease Sister    Hearing loss Sister    Hypertension Brother    Stroke Brother    Diabetes Brother    Arthritis Brother    Stroke Brother    Hypertension Brother    Heart disease Maternal Grandfather    Miscarriages / Stillbirths Daughter    Asthma Daughter    Heart disease Son    Sleep apnea Neg Hx     Her Social History Is Significant For: Social History   Socioeconomic History   Marital status: Divorced    Spouse name: Not on file   Number of children: Not on file   Years of education: Not on file   Highest education level: Not on file  Occupational History   Not on file  Tobacco Use   Smoking status: Former    Types: Cigarettes   Smokeless tobacco: Never  Substance and Sexual Activity   Alcohol use: Yes    Comment: occ   Drug use: Never   Sexual activity: Not Currently  Other Topics Concern   Not on file  Social History Narrative   Not on file   Social Determinants of Health   Financial Resource Strain: Not on file  Food Insecurity: Not on file  Transportation Needs: Not on file  Physical Activity: Not on file  Stress: Not on file  Social Connections: Not on file    Her Allergies Are:  Allergies  Allergen Reactions   Pyridium [Phenazopyridine Hcl] Other (See Comments)    Severe lower back pain    Poison Ivy Extract Other (See Comments)    Swelling of face and eyes  :   Her Current Medications Are:  Outpatient Encounter Medications as of 11/23/2021  Medication Sig   amLODipine (NORVASC) 10 MG tablet    fluticasone (FLONASE) 50 MCG/ACT nasal spray    glipiZIDE (GLUCOTROL) 5 MG tablet Take 1 tablet (5 mg total) by mouth daily before breakfast.   hydrALAZINE (APRESOLINE) 50 MG tablet    latanoprost (XALATAN) 0.005 %  ophthalmic solution    levothyroxine (SYNTHROID) 75 MCG tablet    metoprolol succinate (TOPROL-XL) 100 MG 24 hr tablet    Multiple Vitamin (MULTIVITAMIN) capsule Take 1 capsule by mouth daily.   olmesartan-hydrochlorothiazide (BENICAR HCT) 20-12.5 MG tablet    pramoxine-hydrocortisone (PROCTOCREAM-HC) 1-1 % rectal cream Place 1 application rectally 2 (two) times daily. 2.5%   simvastatin (ZOCOR) 40 MG tablet    spironolactone (ALDACTONE) 25 MG tablet    valACYclovir (VALTREX) 1000 MG tablet Take 1,000 mg by mouth 2 (  two) times daily. prn   aspirin EC 81 MG tablet Take 81 mg by mouth daily. (Patient not taking: Reported on 08/25/2021)   temazepam (RESTORIL) 30 MG capsule Take 30 mg by mouth at bedtime as needed for sleep.   No facility-administered encounter medications on file as of 11/23/2021.  :   Review of Systems:  Out of a complete 14 point review of systems, all are reviewed and negative with the exception of these symptoms as listed below:   Review of Systems  Neurological:        Pt here sleep consult   Pt snores,hypertension   Pt denies headaches,fatigue  Pt states last sleep study was 2018  and received CPAP 2018  Pt states she wears CPAP every night at least 4 hours    ESS:9 FSS:13     Objective:  Neurological Exam  Physical Exam Physical Examination:   Vitals:   11/23/21 1354  BP: 137/71  Pulse: 65    General Examination: The patient is a very pleasant 76 y.o. female in no acute distress. She appears well-developed and well-nourished and well groomed.   HEENT: Normocephalic, atraumatic, pupils are equal, round and reactive to light, extraocular tracking is good without limitation to gaze excursion or nystagmus noted. Hearing is grossly intact. Face is symmetric with normal facial animation. Speech is clear with no dysarthria noted. There is no hypophonia. There is no lip, neck/head, jaw or voice tremor. Neck is supple with full range of passive and active motion.  There are no carotid bruits on auscultation. Oropharynx exam reveals: mild mouth dryness, adequate dental hygiene and mild airway crowding, due to small airway entry.  She has a moderate overbite.  Tonsils are absent.  Mallampati class II.  Chest: Clear to auscultation without wheezing, rhonchi or crackles noted.  Heart: S1+S2+0, regular and normal without murmurs, rubs or gallops noted.   Abdomen: Soft, non-tender and non-distended with normal bowel sounds appreciated on auscultation.  Extremities: There is mild swelling around the left ankle, she has a remote history of injury to the ankle.  Skin: Warm and dry without trophic changes noted.   Musculoskeletal: exam reveals no obvious joint deformities.   Neurologically:  Mental status: The patient is awake, alert and oriented in all 4 spheres. Her immediate and remote memory, attention, language skills and fund of knowledge are appropriate. There is no evidence of aphasia, agnosia, apraxia or anomia. Speech is clear with normal prosody and enunciation. Thought process is linear. Mood is normal and affect is normal.  Cranial nerves II - XII are as described above under HEENT exam.  Motor exam: Normal bulk, strength and tone is noted. There is no tremor, Romberg is negative. Reflexes are 2+ throughout. Fine motor skills and coordination: grossly intact.  Cerebellar testing: No dysmetria or intention tremor. There is no truncal or gait ataxia.  Sensory exam: intact to light touch in the upper and lower extremities.  Gait, station and balance: She stands easily. No veering to one side is noted. No leaning to one side is noted. Posture is age-appropriate and stance is narrow based. Gait shows normal stride length and normal pace. No problems turning are noted.   Assessment and Plan:  In summary, ALIYANA DLUGOSZ is a very pleasant 76 y.o.-year old female with an underlying medical history of hypertension, hyperlipidemia, thyroid disease, reflux  disease, diabetes, chronic kidney disease, depression, heart murmur, and mild obesity, who presents for evaluation of her obstructive sleep apnea.  She was  previously diagnosed in or around 2012 originally and had another sleep study in 2018 in [place].  She has been on AutoPap therapy and is compliant with treatment with good apnea control.  She has been using a nasal mask.  She is currently up-to-date with supplies.  She is commended for her treatment adherence.  She should be eligible for a new machine but we mutually agreed to have her continue with her current machine as she is not keen on getting another sleep test but we can consider pursuing a home sleep test for reevaluation and then issue a new machine for her in the near future.  She is requesting a switch to a different DME company and I placed an order for supplies and we will request a transfer of care.  She is commended for her treatment adherence and advised to follow-up routinely in 1 year in sleep clinic.  She is encouraged to call us with any interim questions or concerns.  Likely, we will pursue a home sleep test at the next visit to be able to qualify her for new equipment.  I answered all her questions today and she is in agreement with our plan. Thank you very much for allowing me to participate in the care of this nice patient. If I can be of any further assistance to you please do not hesitate to call me at [phone].  Sincerely,   Star Age, MD, PhD

## 2021-11-24 ENCOUNTER — Other Ambulatory Visit: Payer: Self-pay | Admitting: Emergency Medicine

## 2021-11-24 DIAGNOSIS — Z1231 Encounter for screening mammogram for malignant neoplasm of breast: Secondary | ICD-10-CM

## 2021-11-25 ENCOUNTER — Telehealth: Payer: Self-pay | Admitting: *Deleted

## 2021-11-25 NOTE — Telephone Encounter (Addendum)
Order for transfer of care for DME and supplies faxed to [place] along with office note, insurance info, and demographics. Received a receipt of confirmation.

## 2021-11-29 ENCOUNTER — Encounter: Payer: Self-pay | Admitting: Emergency Medicine

## 2021-11-29 ENCOUNTER — Ambulatory Visit (INDEPENDENT_AMBULATORY_CARE_PROVIDER_SITE_OTHER): Payer: Medicare Other | Admitting: Emergency Medicine

## 2021-11-29 VITALS — BP 140/76 | HR 72 | Temp 98.1°F | Ht 67.0 in | Wt 211.2 lb

## 2021-11-29 DIAGNOSIS — E039 Hypothyroidism, unspecified: Secondary | ICD-10-CM

## 2021-11-29 DIAGNOSIS — I152 Hypertension secondary to endocrine disorders: Secondary | ICD-10-CM

## 2021-11-29 DIAGNOSIS — E1169 Type 2 diabetes mellitus with other specified complication: Secondary | ICD-10-CM | POA: Diagnosis not present

## 2021-11-29 DIAGNOSIS — E1159 Type 2 diabetes mellitus with other circulatory complications: Secondary | ICD-10-CM | POA: Diagnosis not present

## 2021-11-29 DIAGNOSIS — N1832 Chronic kidney disease, stage 3b: Secondary | ICD-10-CM | POA: Diagnosis not present

## 2021-11-29 DIAGNOSIS — E1122 Type 2 diabetes mellitus with diabetic chronic kidney disease: Secondary | ICD-10-CM | POA: Diagnosis not present

## 2021-11-29 DIAGNOSIS — E785 Hyperlipidemia, unspecified: Secondary | ICD-10-CM

## 2021-11-29 LAB — CBC WITH DIFFERENTIAL/PLATELET
Basophils Absolute: 0.1 10*3/uL (ref 0.0–0.1)
Basophils Relative: 0.9 % (ref 0.0–3.0)
Eosinophils Absolute: 0.1 10*3/uL (ref 0.0–0.7)
Eosinophils Relative: 1.9 % (ref 0.0–5.0)
HCT: 31.8 % — ABNORMAL LOW (ref 36.0–46.0)
Hemoglobin: 10.4 g/dL — ABNORMAL LOW (ref 12.0–15.0)
Lymphocytes Relative: 32.3 % (ref 12.0–46.0)
Lymphs Abs: 2.2 10*3/uL (ref 0.7–4.0)
MCHC: 32.7 g/dL (ref 30.0–36.0)
MCV: 84.4 fl (ref 78.0–100.0)
Monocytes Absolute: 0.5 10*3/uL (ref 0.1–1.0)
Monocytes Relative: 6.9 % (ref 3.0–12.0)
Neutro Abs: 4 10*3/uL (ref 1.4–7.7)
Neutrophils Relative %: 58 % (ref 43.0–77.0)
Platelets: 311 10*3/uL (ref 150.0–400.0)
RBC: 3.76 Mil/uL — ABNORMAL LOW (ref 3.87–5.11)
RDW: 15.2 % (ref 11.5–15.5)
WBC: 6.8 10*3/uL (ref 4.0–10.5)

## 2021-11-29 LAB — POCT GLYCOSYLATED HEMOGLOBIN (HGB A1C): Hemoglobin A1C: 6.2 % — AB (ref 4.0–5.6)

## 2021-11-29 NOTE — Patient Instructions (Signed)
Chronic Kidney Disease, Adult Chronic kidney disease is when lasting damage happens to the kidneys slowly over a long time. The kidneys help to: Make pee (urine). Make hormones. Keep the right amount of fluids and chemicals in the body. Most often, this disease does not go away. You must take steps to help keep the kidney damage from getting worse. If steps are not taken, the kidneys might stop working forever. What are the causes? Diabetes. High blood pressure. Diseases that affect the heart and blood vessels. Other kidney diseases. Diseases of the body's disease-fighting system. A problem with the flow of pee. Infections of the organs that make pee, store it, and take it out of the body. Swelling or irritation of your blood vessels. What increases the risk? Getting older. Having someone in your family who has kidney disease or kidney failure. Having a disease caused by genes. Taking medicines often that harm the kidneys. Being near or having contact with harmful substances. Being very overweight. Using tobacco now or in the past. What are the signs or symptoms? Feeling very tired. Having a swollen face, legs, ankles, or feet. Feeling like you may vomit or vomiting. Not feeling hungry. Being confused or not able to focus. Twitches and cramps in the leg muscles or other muscles. Dry, itchy skin. A taste of metal in your mouth. Making less pee, or making more pee. Shortness of breath. Trouble sleeping. You may also become anemic or get weak bones. Anemic means there is not enough red blood cells or hemoglobin in your blood. You may get symptoms slowly. You may not notice them until the kidney damage gets very bad. How is this treated? Often, there is no cure for this disease. Treatment can help with symptoms and help keep the disease from getting worse. You may need to: Avoid alcohol. Avoid foods that are high in salt, potassium, phosphorous, and protein. Take medicines for  symptoms and to help control other conditions. Have dialysis. This treatment gets harmful waste out of your body. Treat other problems that cause your kidney disease or make it worse. Follow these instructions at home: Medicines Take over-the-counter and prescription medicines only as told by your doctor. Do not take any new medicines, vitamins, or supplements unless your doctor says it is okay. Lifestyle  Do not smoke or use any products that contain nicotine or tobacco. If you need help quitting, ask your doctor. If you drink alcohol: Limit how much you use to: 0-1 drink a day for women who are not pregnant. 0-2 drinks a day for men. Know how much alcohol is in your drink. In the [place] one drink equals one 12 oz bottle of beer (355 mL), one 5 oz glass of wine (148 mL), or one 1 oz glass of hard liquor (44 mL). Stay at a healthy weight. If you need help losing weight, ask your doctor. General instructions  Follow instructions from your doctor about what you cannot eat or drink. Track your blood pressure at home. Tell your doctor about any changes. If you have diabetes, track your blood sugar. Exercise at least 30 minutes a day, 5 days a week. Keep your shots (vaccinations) up to date. Keep all follow-up visits. Where to find more information American Association of Kidney Patients: www.aakp.org National Kidney Foundation: www.kidney.org American Kidney Fund: www.akfinc.org Life Options: www.lifeoptions.org Kidney School: www.kidneyschool.org Contact a doctor if: Your symptoms get worse. You get new symptoms. Get help right away if: You get symptoms of end-stage kidney disease. These   include: Headaches. Losing feeling in your hands or feet. Easy bruising. Having hiccups often. Chest pain. Shortness of breath. Lack of menstrual periods, in women. You have a fever. You make less pee than normal. You have pain or you bleed when you pee or poop. These symptoms may be an  emergency. Get help right away. Call your local emergency services (911 in the [place] Do not wait to see if the symptoms will go away. Do not drive yourself to the hospital. Summary Chronic kidney disease is when lasting damage happens to the kidneys slowly over a long time. Causes of this disease include diabetes and high blood pressure. Often, there is no cure for this disease. Treatment can help symptoms and help keep the disease from getting worse. Treatment may involve lifestyle changes, medicines, and dialysis. This information is not intended to replace advice given to you by your health care provider. Make sure you discuss any questions you have with your health care provider. Document Revised: 07/17/2019 Document Reviewed: 07/17/2019 Elsevier Patient Education  2023 Elsevier Inc.  

## 2021-11-29 NOTE — Assessment & Plan Note (Signed)
Stable.  Diet and nutrition discussed. Continue simvastatin 40 mg daily.  

## 2021-11-29 NOTE — Assessment & Plan Note (Signed)
Well-controlled hypertension. BP Readings from Last 3 Encounters:  11/29/21 (!) 140/76  11/23/21 137/71  08/25/21 130/70  Continue amlodipine 10 mg daily, Benicar HCTZ 20-12.5 mg, metoprolol succinate 100 mg daily, Aldactone 25 mg Cardiovascular risks associated with hypertension and diabetes discussed. Diet and nutrition discussed. Well-controlled diabetes with hemoglobin A1c of 6.2. Continue glipizide 5 mg in the morning.

## 2021-11-29 NOTE — Assessment & Plan Note (Signed)
Clinically euthyroid.  Continue Synthroid 75 mcg daily. 

## 2021-11-29 NOTE — Progress Notes (Signed)
Megan Gentry 76 y.o.   Chief Complaint  Patient presents with   Follow-up    HISTORY OF PRESENT ILLNESS: This is a 76 y.o. female here for 23-month follow-up of diabetes, hypertension, dyslipidemia, and chronic kidney disease. Sees nephrologist on a regular basis. No acute complaints today.  HPI   Prior to Admission medications   Medication Sig Start Date End Date Taking? Authorizing Provider  amLODipine (NORVASC) 10 MG tablet  03/27/19  Yes [provider]  fluticasone (FLONASE) 50 MCG/ACT nasal spray  05/02/19  Yes [provider]  glipiZIDE (GLUCOTROL) 5 MG tablet Take 1 tablet (5 mg total) by mouth daily before breakfast. 08/25/21 02/21/22 Yes Murphy, Ines Bloomer, MD  hydrALAZINE (APRESOLINE) 50 MG tablet  03/25/19  Yes [provider]  latanoprost (XALATAN) 0.005 % ophthalmic solution  04/15/19  Yes [provider]  levothyroxine (SYNTHROID) 75 MCG tablet  03/25/19  Yes [provider]  metoprolol succinate (TOPROL-XL) 100 MG 24 hr tablet  04/19/19  Yes [provider]  Multiple Vitamin (MULTIVITAMIN) capsule Take 1 capsule by mouth daily.   Yes [provider]  olmesartan-hydrochlorothiazide (BENICAR HCT) 20-12.5 MG tablet  05/03/19  Yes [provider]  pramoxine-hydrocortisone (PROCTOCREAM-HC) 1-1 % rectal cream Place 1 application rectally 2 (two) times daily. 2.5%   Yes [provider]  simvastatin (ZOCOR) 40 MG tablet  03/27/19  Yes [provider]  spironolactone (ALDACTONE) 25 MG tablet  03/25/19  Yes [provider]  valACYclovir (VALTREX) 1000 MG tablet Take 1,000 mg by mouth 2 (two) times daily. prn   Yes [provider]  aspirin EC 81 MG tablet Take 81 mg by mouth daily. Patient not taking: Reported on 08/25/2021    [provider]  simvastatin (ZOCOR) 40 MG tablet Take 1 tablet by mouth every evening.    [provider]  temazepam (RESTORIL) 30  MG capsule Take 30 mg by mouth at bedtime as needed for sleep.    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  Social History   Socioeconomic History   Marital status: Divorced    Spouse name: Not on file   Number of children: Not on file   Years of education: Not on file   Highest education level: Not on file  Occupational History   Not on file  Tobacco Use   Smoking status: Former    Types: Cigarettes   Smokeless tobacco: Never  Substance and Sexual Activity   Alcohol use: Yes    Comment: occ   Drug use: Never   Sexual  activity: Not Currently  Other Topics Concern   Not on file  Social History Narrative   Not on file   Social Determinants of Health   Financial Resource Strain: Not on file  Food Insecurity: Not on file  Transportation Needs: Not on file  Physical Activity: Not on file  Stress: Not on file  Social Connections: Not on file  Intimate Partner Violence: Not on file    Family History  Problem Relation Age of Onset   Stroke Mother    Hypertension Mother    Heart disease Mother    Heart disease Father    Alcohol abuse Father    Intellectual  disability Sister    Hypertension Sister    Heart disease Sister    Hearing loss Sister    Hypertension Brother    Stroke Brother    Diabetes Brother    Arthritis Brother    Stroke Brother    Hypertension Brother    Heart disease Maternal Grandfather    Miscarriages / Stillbirths Daughter    Asthma Daughter    Heart disease Son    Sleep apnea Neg Hx      Review of Systems  Constitutional: Negative.  Negative for chills and fever.  HENT: Negative.  Negative for congestion and sore throat.   Respiratory: Negative.  Negative for cough and shortness of breath.   Cardiovascular: Negative.  Negative for chest pain and palpitations.  Gastrointestinal:  Negative for abdominal pain, diarrhea, nausea and vomiting.  Genitourinary: Negative.   Skin: Negative.  Negative for rash.  Neurological: Negative.  Negative for dizziness and headaches.  All other systems reviewed and are negative.  Today's Vitals   11/29/21 1119 11/29/21 1127  BP: (!) 160/70 (!) 140/76  Pulse: 72   Temp: 98.1 F (36.7 C)   TempSrc: Oral   SpO2: 98%   Weight: 211 lb 4 oz (95.8 kg)   Height: 5\' 7"  (1.702 m)    Body mass index is 33.09 kg/m. Wt Readings from Last 3 Encounters:  11/29/21 211 lb 4 oz (95.8 kg)  11/23/21 211 lb (95.7 kg)  08/25/21 209 lb 6 oz (95 kg)     Physical Exam Vitals reviewed.  Constitutional:      Appearance: Normal appearance.  HENT:     Head: Normocephalic.  Eyes:     Extraocular Movements: Extraocular movements intact.     Pupils: Pupils are equal, round, and reactive to light.  Cardiovascular:     Rate and Rhythm: Normal rate and regular rhythm.     Pulses: Normal pulses.     Heart sounds: Normal heart sounds.  Pulmonary:     Effort: Pulmonary effort is normal.     Breath sounds: Normal breath sounds.  Musculoskeletal:        General: Normal range of motion.     Cervical back: No tenderness.  Lymphadenopathy:     Cervical: No cervical adenopathy.  Skin:     General: Skin is warm and dry.     Capillary Refill: Capillary refill takes less than 2 seconds.  Neurological:     General: No focal deficit present.     Mental Status: She is alert and oriented to person, place, and time.  Psychiatric:        Mood and Affect: Mood normal.        Behavior: Behavior normal.     Results for orders placed or performed in visit on 11/29/21 (from the past 24 hour(s))  POCT HgB  A1C     Status: Abnormal   Collection Time: 11/29/21 11:37 AM  Result Value Ref Range   Hemoglobin A1C 6.2 (A) 4.0 - 5.6 %   HbA1c POC (<> result, manual entry)     HbA1c, POC (prediabetic range)     HbA1c, POC (controlled diabetic range)      ASSESSMENT & PLAN: A total of 45 minutes was spent with the patient and counseling/coordination of care regarding preparing for this visit, review of most recent office visit notes, review of multiple chronic medical problems and their management, review of all medications, review of available medical records from [facility] physicians that patient brought in, cardiovascular risks associated with diabetes, hypertension, dyslipidemia, management of chronic kidney disease, education on nutrition, prognosis, documentation, and need for follow-up.  Problem List Items Addressed This Visit       Cardiovascular and Mediastinum   Hypertension associated with diabetes ([place]) - Primary    Well-controlled hypertension. BP Readings from Last 3 Encounters:  11/29/21 (!) 140/76  11/23/21 137/71  08/25/21 130/70  Continue amlodipine 10 mg daily, Benicar HCTZ 20-12.5 mg, metoprolol succinate 100 mg daily, Aldactone 25 mg Cardiovascular risks associated with hypertension and diabetes discussed. Diet and nutrition discussed. Well-controlled diabetes with hemoglobin A1c of 6.2. Continue glipizide 5 mg in the morning.       Relevant Orders   Comprehensive metabolic panel   CBC with Differential/Platelet     Endocrine   Dyslipidemia associated with type 2  diabetes mellitus ([place])    Stable.  Diet and nutrition discussed. Continue simvastatin 40 mg daily.      Relevant Orders   Lipid panel   Hypothyroidism    Clinically euthyroid.  Continue Synthroid 75 mcg daily.      Relevant Orders   TSH     Genitourinary   Stage 3b chronic kidney disease ([place])    Advised to stay well-hydrated and avoid NSAIDs.      Other Visit Diagnoses     Type 2 diabetes mellitus with stage 3b chronic kidney disease, without long-term current use of insulin (HCC)       Relevant Orders   POCT HgB A1C (Completed)      Patient Instructions  Chronic Kidney Disease, Adult Chronic kidney disease is when lasting damage happens to the kidneys slowly over a long time. The kidneys help to: Make pee (urine). Make hormones. Keep the right amount of fluids and chemicals in the body. Most often, this disease does not go away. You must take steps to help keep the kidney damage from getting worse. If steps are not taken, the kidneys might stop working forever. What are the causes? Diabetes. High blood pressure. Diseases that affect the heart and blood vessels. Other kidney diseases. Diseases of the body's disease-fighting system. A problem with the flow of pee. Infections of the organs that make pee, store it, and take it out of the body. Swelling or irritation of your blood vessels. What increases the risk? Getting older. Having someone in your family who has kidney disease or kidney failure. Having a disease caused by genes. Taking medicines often that harm the kidneys. Being near or having contact with harmful substances. Being very overweight. Using tobacco now or in the past. What are the signs or symptoms? Feeling very tired. Having a swollen face, legs, ankles, or feet. Feeling like you may vomit or vomiting. Not feeling hungry. Being confused or not able to focus. Twitches and cramps in the leg muscles or other  muscles. Dry, itchy skin. A taste  of metal in your mouth. Making less pee, or making more pee. Shortness of breath. Trouble sleeping. You may also become anemic or get weak bones. Anemic means there is not enough red blood cells or hemoglobin in your blood. You may get symptoms slowly. You may not notice them until the kidney damage gets very bad. How is this treated? Often, there is no cure for this disease. Treatment can help with symptoms and help keep the disease from getting worse. You may need to: Avoid alcohol. Avoid foods that are high in salt, potassium, phosphorous, and protein. Take medicines for symptoms and to help control other conditions. Have dialysis. This treatment gets harmful waste out of your body. Treat other problems that cause your kidney disease or make it worse. Follow these instructions at home: Medicines Take over-the-counter and prescription medicines only as told by your doctor. Do not take any new medicines, vitamins, or supplements unless your doctor says it is okay. Lifestyle  Do not smoke or use any products that contain nicotine or tobacco. If you need help quitting, ask your doctor. If you drink alcohol: Limit how much you use to: 0-1 drink a day for women who are not pregnant. 0-2 drinks a day for men. Know how much alcohol is in your drink. In the [place] one drink equals one 12 oz bottle of beer (355 mL), one 5 oz glass of wine (148 mL), or one 1 oz glass of hard liquor (44 mL). Stay at a healthy weight. If you need help losing weight, ask your doctor. General instructions  Follow instructions from your doctor about what you cannot eat or drink. Track your blood pressure at home. Tell your doctor about any changes. If you have diabetes, track your blood sugar. Exercise at least 30 minutes a day, 5 days a week. Keep your shots (vaccinations) up to date. Keep all follow-up visits. Where to find more information American Association of Kidney Patients: BombTimer.gl National  Kidney Foundation: www.kidney.[place]: https://mathis.com/ Life Options: www.lifeoptions.org Kidney School: www.kidneyschool.org Contact a doctor if: Your symptoms get worse. You get new symptoms. Get help right away if: You get symptoms of end-stage kidney disease. These include: Headaches. Losing feeling in your hands or feet. Easy bruising. Having hiccups often. Chest pain. Shortness of breath. Lack of menstrual periods, in women. You have a fever. You make less pee than normal. You have pain or you bleed when you pee or poop. These symptoms may be an emergency. Get help right away. Call your local emergency services (911 in the [place] Do not wait to see if the symptoms will go away. Do not drive yourself to the hospital. Summary Chronic kidney disease is when lasting damage happens to the kidneys slowly over a long time. Causes of this disease include diabetes and high blood pressure. Often, there is no cure for this disease. Treatment can help symptoms and help keep the disease from getting worse. Treatment may involve lifestyle changes, medicines, and dialysis. This information is not intended to replace advice given to you by your health care provider. Make sure you discuss any questions you have with your health care provider. Document Revised: 07/17/2019 Document Reviewed: 07/17/2019 Elsevier Patient Education  [place], MD [facility]

## 2021-11-29 NOTE — Assessment & Plan Note (Signed)
Advised to stay well-hydrated and avoid NSAIDs. ?

## 2021-11-30 ENCOUNTER — Other Ambulatory Visit: Payer: Self-pay | Admitting: Emergency Medicine

## 2021-11-30 LAB — COMPREHENSIVE METABOLIC PANEL
ALT: 13 U/L (ref 0–35)
AST: 18 U/L (ref 0–37)
Albumin: 4.8 g/dL (ref 3.5–5.2)
Alkaline Phosphatase: 47 U/L (ref 39–117)
BUN: 40 mg/dL — ABNORMAL HIGH (ref 6–23)
CO2: 22 mEq/L (ref 19–32)
Calcium: 10.3 mg/dL (ref 8.4–10.5)
Chloride: 103 mEq/L (ref 96–112)
Creatinine, Ser: 2.02 mg/dL — ABNORMAL HIGH (ref 0.40–1.20)
GFR: 23.88 mL/min — ABNORMAL LOW (ref >60.00)
Glucose, Bld: 158 mg/dL — ABNORMAL HIGH (ref 70–99)
Potassium: 4.8 mEq/L (ref 3.5–5.1)
Sodium: 141 mEq/L (ref 135–145)
Total Bilirubin: 0.3 mg/dL (ref 0.2–1.2)
Total Protein: 8.2 g/dL (ref 6.0–8.3)

## 2021-11-30 LAB — LIPID PANEL
Cholesterol: 169 mg/dL (ref 0–200)
HDL: 57.2 mg/dL (ref >39.00)
LDL Cholesterol: 85 mg/dL (ref 0–99)
NonHDL: 112.23
Total CHOL/HDL Ratio: 3
Triglycerides: 137 mg/dL (ref 0.0–149.0)
VLDL: 27.4 mg/dL (ref 0.0–40.0)

## 2021-12-08 ENCOUNTER — Ambulatory Visit
Admission: RE | Admit: 2021-12-08 | Discharge: 2021-12-08 | Disposition: A | Discharge status: Home / Self Care | Payer: Medicare Other | Source: Ambulatory Visit | Attending: Emergency Medicine | Admitting: Emergency Medicine

## 2021-12-08 DIAGNOSIS — Z1231 Encounter for screening mammogram for malignant neoplasm of breast: Secondary | ICD-10-CM | POA: Diagnosis not present

## 2021-12-09 DIAGNOSIS — N1832 Chronic kidney disease, stage 3b: Secondary | ICD-10-CM | POA: Diagnosis not present

## 2021-12-09 DIAGNOSIS — N281 Cyst of kidney, acquired: Secondary | ICD-10-CM | POA: Diagnosis not present

## 2021-12-09 DIAGNOSIS — N390 Urinary tract infection, site not specified: Secondary | ICD-10-CM | POA: Diagnosis not present

## 2021-12-09 DIAGNOSIS — D649 Anemia, unspecified: Secondary | ICD-10-CM | POA: Diagnosis not present

## 2021-12-09 DIAGNOSIS — I129 Hypertensive chronic kidney disease with stage 1 through stage 4 chronic kidney disease, or unspecified chronic kidney disease: Secondary | ICD-10-CM | POA: Diagnosis not present

## 2021-12-09 DIAGNOSIS — E1122 Type 2 diabetes mellitus with diabetic chronic kidney disease: Secondary | ICD-10-CM | POA: Diagnosis not present

## 2021-12-09 NOTE — Telephone Encounter (Signed)
Received call from [place] at [facility]-stated they need patients most recent sleep study to be able to issue supplies. Reviewed chart-looks like patient had this done in [place]. Called and LVM for patient asking what facility this was done at so we can get this for [facility] and Dr. Rexene Alberts. His c/b # is [phone] and fax # is [phone].

## 2021-12-10 ENCOUNTER — Other Ambulatory Visit: Payer: Self-pay | Admitting: Internal Medicine

## 2021-12-10 ENCOUNTER — Ambulatory Visit
Admission: RE | Admit: 2021-12-10 | Discharge: 2021-12-10 | Disposition: A | Discharge status: Home / Self Care | Payer: Medicare Other | Source: Ambulatory Visit | Attending: Internal Medicine | Admitting: Internal Medicine

## 2021-12-10 DIAGNOSIS — N281 Cyst of kidney, acquired: Secondary | ICD-10-CM | POA: Diagnosis not present

## 2021-12-15 NOTE — Telephone Encounter (Signed)
Sent results to [place]: Darryl [phone]

## 2021-12-20 ENCOUNTER — Encounter: Payer: Self-pay | Admitting: Neurology

## 2021-12-29 ENCOUNTER — Telehealth: Payer: Self-pay | Admitting: *Deleted

## 2021-12-29 ENCOUNTER — Other Ambulatory Visit: Payer: Self-pay | Admitting: Emergency Medicine

## 2021-12-29 DIAGNOSIS — E039 Hypothyroidism, unspecified: Secondary | ICD-10-CM

## 2021-12-29 DIAGNOSIS — E1169 Type 2 diabetes mellitus with other specified complication: Secondary | ICD-10-CM

## 2021-12-29 MED ORDER — OLMESARTAN MEDOXOMIL-HCTZ 20-12.5 MG PO TABS: 1.0000 | ORAL_TABLET | Freq: Every day | ORAL | 3 refills | Status: AC

## 2021-12-29 MED ORDER — SIMVASTATIN 40 MG PO TABS: 40.0000 mg | ORAL_TABLET | Freq: Every day | ORAL | 3 refills | Status: DC

## 2021-12-29 MED ORDER — HYDRALAZINE HCL 50 MG PO TABS: 50.0000 mg | ORAL_TABLET | Freq: Two times a day (BID) | ORAL | 3 refills | Status: DC

## 2021-12-29 MED ORDER — LEVOTHYROXINE SODIUM 75 MCG PO TABS: 75.0000 ug | ORAL_TABLET | Freq: Every day | ORAL | 3 refills | Status: DC

## 2021-12-29 NOTE — Telephone Encounter (Signed)
Pharmacy sent a rx request for Spironolactone and Olmesartan-HCTZ. RX was sent in by another provider.   Please advise if ok to fill

## 2021-12-29 NOTE — Telephone Encounter (Signed)
How is she taking the spironolactone, dose and frequency?

## 2021-12-29 NOTE — Telephone Encounter (Signed)
Patient is requesting a refill of her Levothyroxine, Simvastatin, and Hydralazine. Previous rx was sent by another provider. Please advise if ok to fill

## 2021-12-29 NOTE — Telephone Encounter (Signed)
New prescription sent to pharmacy of record.  Thanks.

## 2021-12-29 NOTE — Telephone Encounter (Signed)
Levothyroxine and simvastatin refilled.  Call and inquire about hydralazine.  If she taking this medication and if she is dose and frequency?  Thanks.

## 2021-12-29 NOTE — Telephone Encounter (Signed)
Pt states that the Rx simvastatin (ZOCOR) 40 MG tablet and levothyroxine (SYNTHROID) 75 MCG tablet was sent to the wrong pharmacy.  Pharmacy pt would like them to go to is: [facility], [place]  Please advise

## 2021-12-30 ENCOUNTER — Other Ambulatory Visit: Payer: Self-pay | Admitting: Emergency Medicine

## 2021-12-30 MED ORDER — SPIRONOLACTONE 25 MG PO TABS: 25.0000 mg | ORAL_TABLET | Freq: Every day | ORAL | 3 refills | Status: DC

## 2021-12-30 NOTE — Telephone Encounter (Signed)
New prescription sent to pharmacy of record.  Thanks.

## 2022-01-04 ENCOUNTER — Encounter: Payer: Self-pay | Admitting: Emergency Medicine

## 2022-01-04 ENCOUNTER — Ambulatory Visit (INDEPENDENT_AMBULATORY_CARE_PROVIDER_SITE_OTHER): Payer: Medicare Other | Admitting: Emergency Medicine

## 2022-01-04 VITALS — BP 142/74 | HR 65 | Temp 98.2°F | Ht 67.0 in | Wt 207.5 lb

## 2022-01-04 DIAGNOSIS — Z23 Encounter for immunization: Secondary | ICD-10-CM | POA: Diagnosis not present

## 2022-01-04 DIAGNOSIS — J988 Other specified respiratory disorders: Secondary | ICD-10-CM

## 2022-01-04 DIAGNOSIS — E039 Hypothyroidism, unspecified: Secondary | ICD-10-CM

## 2022-01-04 DIAGNOSIS — B9789 Other viral agents as the cause of diseases classified elsewhere: Secondary | ICD-10-CM | POA: Diagnosis not present

## 2022-01-04 LAB — POC COVID19 BINAXNOW: SARS Coronavirus 2 Ag: NEGATIVE

## 2022-01-04 MED ORDER — LEVOTHYROXINE SODIUM 75 MCG PO TABS: 75.0000 ug | ORAL_TABLET | Freq: Every day | ORAL | 3 refills | Status: DC

## 2022-01-04 NOTE — Assessment & Plan Note (Signed)
Clinical stable.  No red flag signs or symptoms. Viral infection running its course. No complications. Progressively getting better. Continue over-the-counter medications as needed. Advised to rest and stay well-hydrated. Contact the office if no better or worse during the next several days

## 2022-01-04 NOTE — Patient Instructions (Signed)

## 2022-01-04 NOTE — Progress Notes (Signed)
Megan Gentry 76 y.o.   Chief Complaint  Patient presents with   Nasal Congestion    Running nose, sneezing, dry cough changed to a flem.    HISTORY OF PRESENT ILLNESS: Acute problem visit today. This is a 76 y.o. female complaining of runny nose, sneezing, watery itchy followed by dry cough that started a little over a week ago.  Slowly getting better.  No other associated symptoms. No other complaints or medical concerns today.  HPI   Prior to Admission medications   Medication Sig Start Date End Date Taking? Authorizing Provider  amLODipine (NORVASC) 10 MG tablet  03/27/19  Yes [provider]  fluticasone (FLONASE) 50 MCG/ACT nasal spray  05/02/19  Yes [provider]  glipiZIDE (GLUCOTROL) 5 MG tablet Take 1 tablet (5 mg total) by mouth daily before breakfast. 08/25/21 02/21/22 Yes Laiyah Exline, Ines Bloomer, MD  hydrALAZINE (APRESOLINE) 50 MG tablet Take 1 tablet (50 mg total) by mouth 2 (two) times daily. 12/29/21 12/24/22 Yes Lavere Stork, Ines Bloomer, MD  latanoprost (XALATAN) 0.005 % ophthalmic solution  04/15/19  Yes [provider]  metoprolol succinate (TOPROL-XL) 100 MG 24 hr tablet  04/19/19  Yes [provider]  Multiple Vitamin (MULTIVITAMIN) capsule Take 1 capsule by mouth daily.   Yes [provider]  olmesartan-hydrochlorothiazide (BENICAR HCT) 20-12.5 MG tablet Take 1 tablet by mouth daily. 12/29/21 12/24/22 Yes Jacai Kipp, Ines Bloomer, MD  pramoxine-hydrocortisone (PROCTOCREAM-HC) 1-1 % rectal cream Place 1 application rectally 2 (two) times daily. 2.5%   Yes [provider]  simvastatin (ZOCOR) 40 MG tablet Take 1 tablet (40 mg total) by mouth daily. 12/29/21 12/24/22 Yes River Mckercher, Ines Bloomer, MD  spironolactone (ALDACTONE) 25 MG tablet Take 1 tablet (25 mg total) by mouth daily. 12/30/21 12/25/22 Yes Vearl Allbaugh, Ines Bloomer, MD  valACYclovir (VALTREX) 1000 MG tablet Take 1,000 mg by mouth 2 (two) times daily. prn   Yes [provider]  aspirin EC 81 MG tablet Take 81 mg by mouth daily. Patient not taking: Reported on 08/25/2021    [provider]  levothyroxine (SYNTHROID) 75 MCG tablet Take 1 tablet (75 mcg total) by mouth daily before breakfast. 01/04/22 12/30/22  Horald Pollen, MD  temazepam (RESTORIL) 30 MG capsule Take 30 mg by mouth at bedtime as needed for sleep.    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  Social History   Socioeconomic History   Marital status: Divorced    Spouse name: Not on file   Number of children: Not  on file   Years of education: Not on file   Highest education level: Not on file  Occupational History   Not on file  Tobacco Use   Smoking status: Former    Types: Cigarettes   Smokeless tobacco: Never  Substance and Sexual Activity   Alcohol use: Yes    Comment: occ   Drug use: Never   Sexual activity: Not Currently  Other Topics Concern   Not on file  Social History Narrative   Not on file   Social Determinants of Health   Financial Resource Strain: Not on file  Food Insecurity: Not  on file  Transportation Needs: Not on file  Physical Activity: Not on file  Stress: Not on file  Social Connections: Not on file  Intimate Partner Violence: Not on file    Family History  Problem Relation Age of Onset   Stroke Mother    Hypertension Mother    Heart disease Mother    Heart disease Father    Alcohol abuse Father    Intellectual disability Sister    Hypertension Sister    Heart disease Sister    Hearing loss Sister    Hypertension Brother    Stroke Brother    Diabetes Brother    Arthritis Brother    Stroke Brother    Hypertension Brother    Heart disease Maternal Grandfather    Miscarriages / Stillbirths Daughter    Asthma Daughter    Heart disease Son    Sleep apnea Neg Hx      Review of Systems  Constitutional: Negative.  Negative for chills and fever.  HENT:  Positive for congestion and ear pain. Negative for sore throat.   Respiratory:  Positive for cough. Negative for shortness of breath.   Cardiovascular:  Negative for chest pain and palpitations.  Gastrointestinal:  Negative for abdominal pain, constipation, diarrhea, nausea and vomiting.  Genitourinary: Negative.   Skin: Negative.  Negative for rash.  Neurological: Negative.  Negative for dizziness and headaches.  All other systems reviewed and are negative.  Today's Vitals   01/04/22 1327  BP: (!) 142/74  Pulse: 65  Temp: 98.2 F (36.8 C)  TempSrc: Oral  SpO2: 96%  Weight: 207 lb 8 oz (94.1 kg)  Height: 5\' 7"  (1.702 m)   Body mass index is 32.5 kg/m.   Physical Exam Vitals reviewed.  Constitutional:      Appearance: Normal appearance.  HENT:     Head: Normocephalic.     Right Ear: Tympanic membrane, ear canal and external ear normal.     Left Ear: Tympanic membrane, ear canal and external ear normal.     Nose: Nose normal.     Mouth/Throat:     Mouth: Mucous membranes are moist.     Pharynx: Oropharynx is clear.  Eyes:     Extraocular Movements: Extraocular movements  intact.     Conjunctiva/sclera: Conjunctivae normal.     Pupils: Pupils are equal, round, and reactive to light.  Cardiovascular:     Rate and Rhythm: Normal rate and regular rhythm.     Pulses: Normal pulses.     Heart sounds: Normal heart sounds.  Pulmonary:     Effort: Pulmonary effort is normal.  Musculoskeletal:        General: Normal range of motion.     Cervical back: Neck supple.  Lymphadenopathy:     Cervical: No cervical adenopathy.  Skin:    General: Skin is warm and dry.  Capillary Refill: Capillary refill takes less than 2 seconds.  Neurological:     General: No focal deficit present.     Mental Status: She is alert and oriented to person, place, and time.  Psychiatric:        Mood and Affect: Mood normal.        Behavior: Behavior normal.    Results for orders placed or performed in visit on 01/04/22 (from the past 24 hour(s))  POC COVID-19     Status: Normal   Collection Time: 01/04/22  2:03 PM  Result Value Ref Range   SARS Coronavirus 2 Ag Negative Negative     ASSESSMENT & PLAN: Problem List Items Addressed This Visit       Respiratory   Viral respiratory infection - Primary    Clinical stable.  No red flag signs or symptoms. Viral infection running its course. No complications. Progressively getting better. Continue over-the-counter medications as needed. Advised to rest and stay well-hydrated. Contact the office if no better or worse during the next several days        Endocrine   Hypothyroidism   Relevant Medications   levothyroxine (SYNTHROID) 75 MCG tablet   Other Visit Diagnoses     Need for vaccination       Relevant Orders   Flu Vaccine QUAD High Dose(Fluad)      Patient Instructions  Viral Respiratory Infection A viral respiratory infection is an illness that affects parts of the body that are used for breathing. These include the lungs, nose, and throat. It is caused by a germ called a virus. Some examples of this kind of  infection are: A cold. The flu (influenza). A respiratory syncytial virus (RSV) infection. What are the causes? This condition is caused by a virus. It spreads from person to person. You can get the virus if: You breathe in droplets from someone who is sick. You come in contact with people who are sick. You touch mucus or other fluid from a person who is sick. What are the signs or symptoms? Symptoms of this condition include: A stuffy or runny nose. A sore throat. A cough. Shortness of breath. Trouble breathing. Yellow or green fluid in the nose. Other symptoms may include: A fever. Sweating or chills. Tiredness (fatigue). Achy muscles. A headache. How is this treated? This condition may be treated with: Medicines that treat viruses. Medicines that make it easy to breathe. Medicines that are sprayed into the nose. Acetaminophen or NSAIDs, such as ibuprofen, to treat fever. Follow these instructions at home: Managing pain and congestion Take over-the-counter and prescription medicines only as told by your doctor. If you have a sore throat, gargle with salt water. Do this 3-4 times a day or as needed. To make salt water, dissolve -1 tsp (3-6 g) of salt in 1 cup (237 mL) of warm water. Make sure that all the salt dissolves. Use nose drops made from salt water. This helps with stuffiness (congestion). It also helps soften the skin around your nose. Take 2 tsp (10 mL) of honey at bedtime to lessen coughing at night. Do not give honey to children who are younger than 19 year old. Drink enough fluid to keep your pee (urine) pale yellow. General instructions  Rest as much as possible. Do not drink alcohol. Do not smoke or use any products that contain nicotine or tobacco. If you need help quitting, ask your doctor. Keep all follow-up visits. How is this prevented?  Get a flu shot every year. Ask your doctor when you should get your flu shot. Do not let other people get  your germs. If you are sick: Wash your hands with soap and water often. Wash your hands after you cough or sneeze. Wash hands for at least 20 seconds. If you cannot use soap and water, use hand sanitizer. Cover your mouth when you cough. Cover your nose and mouth when you sneeze. Do not share cups or eating utensils. Clean commonly used objects often. Clean commonly touched surfaces. Stay home from work or school. Avoid contact with people who are sick during cold and flu season. This is in fall and winter. Get help if: Your symptoms last for 10 days or longer. Your symptoms get worse over time. You have very bad pain in your face or forehead. Parts of your jaw or neck get very swollen. You have shortness of breath. Get help right away if: You feel pain or pressure in your chest. You have trouble breathing. You faint or feel like you will faint. You keep vomiting and it gets worse. You feel confused. These symptoms may be an emergency. Get help right away. Call your local emergency services (911 in the [place] Do not wait to see if the symptoms will go away. Do not drive yourself to the hospital. Summary A viral respiratory infection is an illness that affects parts of the body that are used for breathing. Examples of this illness include a cold, the flu, and a respiratory syncytial virus (RSV) infection. The infection can cause a runny nose, cough, sore throat, and fever. Follow what your doctor tells you about taking medicines, drinking lots of fluid, washing your hands, resting at home, and avoiding people who are sick. This information is not intended to replace advice given to you by your health care provider. Make sure you discuss any questions you have with your health care provider. Document Revised: 07/16/2020 Document Reviewed: 07/16/2020 Elsevier Patient Education  [place], MD [facility]

## 2022-03-01 ENCOUNTER — Ambulatory Visit (INDEPENDENT_AMBULATORY_CARE_PROVIDER_SITE_OTHER): Payer: Medicare Other | Admitting: Emergency Medicine

## 2022-03-01 ENCOUNTER — Encounter: Payer: Self-pay | Admitting: Emergency Medicine

## 2022-03-01 VITALS — BP 136/74 | HR 57 | Temp 98.4°F | Ht 67.0 in | Wt 207.4 lb

## 2022-03-01 DIAGNOSIS — E1159 Type 2 diabetes mellitus with other circulatory complications: Secondary | ICD-10-CM

## 2022-03-01 DIAGNOSIS — E1169 Type 2 diabetes mellitus with other specified complication: Secondary | ICD-10-CM | POA: Diagnosis not present

## 2022-03-01 DIAGNOSIS — E785 Hyperlipidemia, unspecified: Secondary | ICD-10-CM | POA: Diagnosis not present

## 2022-03-01 DIAGNOSIS — E039 Hypothyroidism, unspecified: Secondary | ICD-10-CM

## 2022-03-01 DIAGNOSIS — I152 Hypertension secondary to endocrine disorders: Secondary | ICD-10-CM | POA: Diagnosis not present

## 2022-03-01 DIAGNOSIS — N1832 Chronic kidney disease, stage 3b: Secondary | ICD-10-CM | POA: Diagnosis not present

## 2022-03-01 DIAGNOSIS — E1122 Type 2 diabetes mellitus with diabetic chronic kidney disease: Secondary | ICD-10-CM

## 2022-03-01 LAB — COMPREHENSIVE METABOLIC PANEL
ALT: 12 U/L (ref 0–35)
AST: 18 U/L (ref 0–37)
Albumin: 4.9 g/dL (ref 3.5–5.2)
Alkaline Phosphatase: 49 U/L (ref 39–117)
BUN: 36 mg/dL — ABNORMAL HIGH (ref 6–23)
CO2: 24 mEq/L (ref 19–32)
Calcium: 10.1 mg/dL (ref 8.4–10.5)
Chloride: 106 mEq/L (ref 96–112)
Creatinine, Ser: 1.85 mg/dL — ABNORMAL HIGH (ref 0.40–1.20)
GFR: 26.49 mL/min — ABNORMAL LOW (ref >60.00)
Glucose, Bld: 117 mg/dL — ABNORMAL HIGH (ref 70–99)
Potassium: 4.6 mEq/L (ref 3.5–5.1)
Sodium: 138 mEq/L (ref 135–145)
Total Bilirubin: 0.3 mg/dL (ref 0.2–1.2)
Total Protein: 8.3 g/dL (ref 6.0–8.3)

## 2022-03-01 LAB — CBC WITH DIFFERENTIAL/PLATELET
Basophils Absolute: 0 10*3/uL (ref 0.0–0.1)
Basophils Relative: 0.2 % (ref 0.0–3.0)
Eosinophils Absolute: 0.1 10*3/uL (ref 0.0–0.7)
Eosinophils Relative: 1.9 % (ref 0.0–5.0)
HCT: 33.5 % — ABNORMAL LOW (ref 36.0–46.0)
Hemoglobin: 10.7 g/dL — ABNORMAL LOW (ref 12.0–15.0)
Lymphocytes Relative: 42.4 % (ref 12.0–46.0)
Lymphs Abs: 3.1 10*3/uL (ref 0.7–4.0)
MCHC: 31.9 g/dL (ref 30.0–36.0)
MCV: 84.8 fl (ref 78.0–100.0)
Monocytes Absolute: 0.5 10*3/uL (ref 0.1–1.0)
Monocytes Relative: 7.5 % (ref 3.0–12.0)
Neutro Abs: 3.5 10*3/uL (ref 1.4–7.7)
Neutrophils Relative %: 48 % (ref 43.0–77.0)
Platelets: 362 10*3/uL (ref 150.0–400.0)
RBC: 3.94 Mil/uL (ref 3.87–5.11)
RDW: 15.3 % (ref 11.5–15.5)
WBC: 7.3 10*3/uL (ref 4.0–10.5)

## 2022-03-01 LAB — MICROALBUMIN / CREATININE URINE RATIO
Creatinine,U: 21.8 mg/dL
Microalb Creat Ratio: 20.7 mg/g (ref 0.0–30.0)
Microalb, Ur: 4.5 mg/dL — ABNORMAL HIGH (ref 0.0–1.9)

## 2022-03-01 LAB — TSH: TSH: 2.12 u[IU]/mL (ref 0.35–5.50)

## 2022-03-01 LAB — LIPID PANEL
Cholesterol: 183 mg/dL (ref 0–200)
HDL: 65.8 mg/dL (ref >39.00)
LDL Cholesterol: 93 mg/dL (ref 0–99)
NonHDL: 117.51
Total CHOL/HDL Ratio: 3
Triglycerides: 124 mg/dL (ref 0.0–149.0)
VLDL: 24.8 mg/dL (ref 0.0–40.0)

## 2022-03-01 LAB — POCT GLYCOSYLATED HEMOGLOBIN (HGB A1C): Hemoglobin A1C: 6 % — AB (ref 4.0–5.6)

## 2022-03-01 MED ORDER — GLIPIZIDE 5 MG PO TABS: 5.0000 mg | ORAL_TABLET | Freq: Every day | ORAL | 1 refills | Status: DC

## 2022-03-01 NOTE — Assessment & Plan Note (Signed)
Stable.  Advised to stay well-hydrated and avoid NSAIDs. 

## 2022-03-01 NOTE — Assessment & Plan Note (Signed)
Stable.  Diet and nutrition discussed. Continue simvastatin 40 mg daily.  

## 2022-03-01 NOTE — Assessment & Plan Note (Signed)
Clinically euthyroid.  TSH done today. Continue Synthroid 75 mcg daily. 

## 2022-03-01 NOTE — Progress Notes (Signed)
Megan Gentry 76 y.o.   Chief Complaint  Patient presents with   Follow-up    17mnth f/u appt, no concerns     HISTORY OF PRESENT ILLNESS: This is a 76 y.o. female here for 85-month follow-up of hypertension and diabetes. Has no complaints or medical concerns today.  HPI   Prior to Admission medications   Medication Sig Start Date End Date Taking? Authorizing Provider  amLODipine (NORVASC) 10 MG tablet  03/27/19  Yes [provider]  fluticasone (FLONASE) 50 MCG/ACT nasal spray  05/02/19  Yes [provider]  hydrALAZINE (APRESOLINE) 50 MG tablet Take 1 tablet (50 mg total) by mouth 2 (two) times daily. 12/29/21 12/24/22 Yes [place], Ines Bloomer, MD  latanoprost (XALATAN) 0.005 % ophthalmic solution  04/15/19  Yes [provider]  levothyroxine (SYNTHROID) 75 MCG tablet Take 1 tablet (75 mcg total) by mouth daily before breakfast. 01/04/22 12/30/22 Yes Roshan Roback, Ines Bloomer, MD  metoprolol succinate (TOPROL-XL) 100 MG 24 hr tablet  04/19/19  Yes [provider]  Multiple Vitamin (MULTIVITAMIN) capsule Take 1 capsule by mouth daily.   Yes [provider]  olmesartan-hydrochlorothiazide (BENICAR HCT) 20-12.5 MG tablet Take 1 tablet by mouth daily. 12/29/21 12/24/22 Yes Tanisa Lagace, Ines Bloomer, MD  pramoxine-hydrocortisone (PROCTOCREAM-HC) 1-1 % rectal cream Place 1 application rectally 2 (two) times daily. 2.5%   Yes [provider]  simvastatin (ZOCOR) 40 MG tablet Take 1 tablet (40 mg total) by mouth daily. 12/29/21 12/24/22 Yes Khloei Spiker, Ines Bloomer, MD  spironolactone (ALDACTONE) 25 MG tablet Take 1 tablet (25 mg total) by mouth daily. 12/30/21 12/25/22 Yes Naleyah Ohlinger, Ines Bloomer, MD  valACYclovir (VALTREX) 1000 MG tablet Take 1,000 mg by mouth 2 (two) times daily. prn   Yes [provider]  glipiZIDE (GLUCOTROL) 5 MG tablet Take 1 tablet (5 mg total) by mouth daily before breakfast. 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  Social History   Socioeconomic History   Marital status: Divorced    Spouse name: Not on file   Number of children: Not on file   Years of education: Not on file   Highest education level: Not on file  Occupational History   Not on file  Tobacco Use   Smoking status: Former    Types: Cigarettes   Smokeless tobacco: Never  Substance and Sexual Activity   Alcohol use: Yes    Comment: occ   Drug use: Never   Sexual activity: Not  Currently  Other Topics Concern   Not on file  Social History Narrative   Not on file   Social Determinants of Health   Financial Resource Strain: Not on file  Food Insecurity: Not on file  Transportation Needs: Not on file  Physical Activity: Not on file  Stress: Not on file  Social Connections: Not on file  Intimate Partner Violence: Not on file    Family History  Problem Relation Age of Onset   Stroke Mother    Hypertension Mother    Heart disease Mother    Heart disease Father    Alcohol abuse Father     Intellectual disability Sister    Hypertension Sister    Heart disease Sister    Hearing loss Sister    Hypertension Brother    Stroke Brother    Diabetes Brother    Arthritis Brother    Stroke Brother    Hypertension Brother    Heart disease Maternal Grandfather    Miscarriages / Stillbirths Daughter    Asthma Daughter    Heart disease Son    Sleep apnea Neg Hx      Review of Systems  Constitutional: Negative.  Negative for chills and fever.  HENT: Negative.  Negative for congestion and sore throat.   Respiratory: Negative.  Negative for cough and shortness of breath.   Cardiovascular: Negative.  Negative for chest pain and palpitations.  Gastrointestinal:  Negative for abdominal pain, diarrhea, nausea and vomiting.  Genitourinary: Negative.   Skin: Negative.  Negative for rash.  Neurological: Negative.  Negative for dizziness and headaches.  All other systems reviewed and are negative.   Today's Vitals   03/01/22 1113  BP: 136/74  Pulse: (!) 57  Temp: 98.4 F (36.9 C)  TempSrc: Oral  SpO2: 97%  Weight: 207 lb 6 oz (94.1 kg)  Height: 5\' 7"  (1.702 m)   Body mass index is 32.48 kg/m. Wt Readings from Last 3 Encounters:  03/01/22 207 lb 6 oz (94.1 kg)  01/04/22 207 lb 8 oz (94.1 kg)  11/29/21 211 lb 4 oz (95.8 kg)    Physical Exam Vitals reviewed.  Constitutional:      Appearance: Normal appearance.  HENT:     Head: Normocephalic.  Eyes:     Extraocular Movements: Extraocular movements intact.     Pupils: Pupils are equal, round, and reactive to light.  Cardiovascular:     Rate and Rhythm: Normal rate and regular rhythm.     Pulses: Normal pulses.     Heart sounds: Normal heart sounds.  Pulmonary:     Effort: Pulmonary effort is normal.     Breath sounds: Normal breath sounds.  Musculoskeletal:     Cervical back: No tenderness.     Right lower leg: No edema.     Left lower leg: No edema.  Lymphadenopathy:     Cervical: No cervical adenopathy.   Skin:    General: Skin is warm and dry.  Neurological:     General: No focal deficit present.     Mental Status: She is alert and oriented to person, place, and time.  Psychiatric:        Mood and Affect: Mood normal.        Behavior: Behavior normal.     Results for orders placed or performed in visit on 03/01/22 (from the past 24 hour(s))  POCT HgB A1C     Status: Abnormal   Collection Time: 03/01/22 11:25 AM  Result Value  Ref Range   Hemoglobin A1C 6.0 (A) 4.0 - 5.6 %   HbA1c POC (<> result, manual entry)     HbA1c, POC (prediabetic range)     HbA1c, POC (controlled diabetic range)      ASSESSMENT & PLAN: A total of 42 minutes was spent with the patient and counseling/coordination of care regarding preparing for this visit, review of most recent office visit notes, review of most recent blood work results including interpretation of today's hemoglobin A1c, review of multiple chronic medical conditions and their management, review of all medications, education on nutrition, cardiovascular risks associated with hypertension and diabetes, prognosis, documentation, need for follow-up.  Problem List Items Addressed This Visit       Cardiovascular and Mediastinum   Hypertension associated with diabetes ([place]) - Primary    Well-controlled hypertension. Continue Benicar HCT 20-12.5 mg daily, metoprolol succinate 100 mg daily hydralazine 50 mg twice a day, amlodipine 10 mg daily, and Aldactone 25 mg daily. Cardiovascular risks associated with diabetes and hypertension discussed Well-controlled diabetes with hemoglobin A1c of 6.0 Diet and nutrition discussed. Follow-up in 6 months.      Relevant Medications   glipiZIDE (GLUCOTROL) 5 MG tablet   Other Relevant Orders   CBC with Differential/Platelet   Comprehensive metabolic panel   Urine Microalbumin w/creat. ratio     Endocrine   Dyslipidemia associated with type 2 diabetes mellitus (HCC)    Stable.  Diet and nutrition  discussed. Continue simvastatin 40 mg daily.      Relevant Medications   glipiZIDE (GLUCOTROL) 5 MG tablet   Other Relevant Orders   CBC with Differential/Platelet   Lipid panel   Hypothyroidism    Clinically euthyroid.  TSH done today. Continue Synthroid 75 mcg daily.      Relevant Orders   CBC with Differential/Platelet   TSH     Genitourinary   Stage 3b chronic kidney disease ([place])    Stable.  Advised to stay well-hydrated and avoid NSAIDs.      Other Visit Diagnoses     Type 2 diabetes mellitus with stage 3b chronic kidney disease, without long-term current use of insulin (HCC)       Relevant Medications   glipiZIDE (GLUCOTROL) 5 MG tablet   Other Relevant Orders   POCT HgB A1C (Completed)      Patient Instructions  Health Maintenance After Age 36 After age 35, you are at a higher risk for certain long-term diseases and infections as well as injuries from falls. Falls are a major cause of broken bones and head injuries in people who are older than age 83. Getting regular preventive care can help to keep you healthy and well. Preventive care includes getting regular testing and making lifestyle changes as recommended by your health care provider. Talk with your health care provider about: Which screenings and tests you should have. A screening is a test that checks for a disease when you have no symptoms. A diet and exercise plan that is right for you. What should I know about screenings and tests to prevent falls? Screening and testing are the best ways to find a health problem early. Early diagnosis and treatment give you the best chance of managing medical conditions that are common after age 71. Certain conditions and lifestyle choices may make you more likely to have a fall. Your health care provider may recommend: Regular vision checks. Poor vision and conditions such as cataracts can make you more likely to have a fall. If  you wear glasses, make sure to get your  prescription updated if your vision changes. Medicine review. Work with your health care provider to regularly review all of the medicines you are taking, including over-the-counter medicines. Ask your health care provider about any side effects that may make you more likely to have a fall. Tell your health care provider if any medicines that you take make you feel dizzy or sleepy. Strength and balance checks. Your health care provider may recommend certain tests to check your strength and balance while standing, walking, or changing positions. Foot health exam. Foot pain and numbness, as well as not wearing proper footwear, can make you more likely to have a fall. Screenings, including: Osteoporosis screening. Osteoporosis is a condition that causes the bones to get weaker and break more easily. Blood pressure screening. Blood pressure changes and medicines to control blood pressure can make you feel dizzy. Depression screening. You may be more likely to have a fall if you have a fear of falling, feel depressed, or feel unable to do activities that you used to do. Alcohol use screening. Using too much alcohol can affect your balance and may make you more likely to have a fall. Follow these instructions at home: Lifestyle Do not drink alcohol if: Your health care provider tells you not to drink. If you drink alcohol: Limit how much you have to: 0-1 drink a day for women. 0-2 drinks a day for men. Know how much alcohol is in your drink. In the [place] one drink equals one 12 oz bottle of beer (355 mL), one 5 oz glass of wine (148 mL), or one 1 oz glass of hard liquor (44 mL). Do not use any products that contain nicotine or tobacco. These products include cigarettes, chewing tobacco, and vaping devices, such as e-cigarettes. If you need help quitting, ask your health care provider. Activity  Follow a regular exercise program to stay fit. This will help you maintain your balance. Ask your health  care provider what types of exercise are appropriate for you. If you need a cane or walker, use it as recommended by your health care provider. Wear supportive shoes that have nonskid soles. Safety  Remove any tripping hazards, such as rugs, cords, and clutter. Install safety equipment such as grab bars in bathrooms and safety rails on stairs. Keep rooms and walkways well-lit. General instructions Talk with your health care provider about your risks for falling. Tell your health care provider if: You fall. Be sure to tell your health care provider about all falls, even ones that seem minor. You feel dizzy, tiredness (fatigue), or off-balance. Take over-the-counter and prescription medicines only as told by your health care provider. These include supplements. Eat a healthy diet and maintain a healthy weight. A healthy diet includes low-fat dairy products, low-fat (lean) meats, and fiber from whole grains, beans, and lots of fruits and vegetables. Stay current with your vaccines. Schedule regular health, dental, and eye exams. Summary Having a healthy lifestyle and getting preventive care can help to protect your health and wellness after age 24. Screening and testing are the best way to find a health problem early and help you avoid having a fall. Early diagnosis and treatment give you the best chance for managing medical conditions that are more common for people who are older than age 30. Falls are a major cause of broken bones and head injuries in people who are older than age 75. Take precautions to prevent a fall at  home. Work with your health care provider to learn what changes you can make to improve your health and wellness and to prevent falls. This information is not intended to replace advice given to you by your health care provider. Make sure you discuss any questions you have with your health care provider. Document Revised: 08/31/2020 Document Reviewed: 08/31/2020 Elsevier  Patient Education  [place], MD [facility]

## 2022-03-01 NOTE — Assessment & Plan Note (Signed)
Well-controlled hypertension. Continue Benicar HCT 20-12.5 mg daily, metoprolol succinate 100 mg daily hydralazine 50 mg twice a day, amlodipine 10 mg daily, and Aldactone 25 mg daily. Cardiovascular risks associated with diabetes and hypertension discussed Well-controlled diabetes with hemoglobin A1c of 6.0 Diet and nutrition discussed. Follow-up in 6 months.

## 2022-03-01 NOTE — Patient Instructions (Signed)
Health Maintenance After Age 74 After age 74, you are at a higher risk for certain long-term diseases and infections as well as injuries from falls. Falls are a major cause of broken bones and head injuries in people who are older than age 74. Getting regular preventive care can help to keep you healthy and well. Preventive care includes getting regular testing and making lifestyle changes as recommended by your health care provider. Talk with your health care provider about: Which screenings and tests you should have. A screening is a test that checks for a disease when you have no symptoms. A diet and exercise plan that is right for you. What should I know about screenings and tests to prevent falls? Screening and testing are the best ways to find a health problem early. Early diagnosis and treatment give you the best chance of managing medical conditions that are common after age 74. Certain conditions and lifestyle choices may make you more likely to have a fall. Your health care provider may recommend: Regular vision checks. Poor vision and conditions such as cataracts can make you more likely to have a fall. If you wear glasses, make sure to get your prescription updated if your vision changes. Medicine review. Work with your health care provider to regularly review all of the medicines you are taking, including over-the-counter medicines. Ask your health care provider about any side effects that may make you more likely to have a fall. Tell your health care provider if any medicines that you take make you feel dizzy or sleepy. Strength and balance checks. Your health care provider may recommend certain tests to check your strength and balance while standing, walking, or changing positions. Foot health exam. Foot pain and numbness, as well as not wearing proper footwear, can make you more likely to have a fall. Screenings, including: Osteoporosis screening. Osteoporosis is a condition that causes  the bones to get weaker and break more easily. Blood pressure screening. Blood pressure changes and medicines to control blood pressure can make you feel dizzy. Depression screening. You may be more likely to have a fall if you have a fear of falling, feel depressed, or feel unable to do activities that you used to do. Alcohol use screening. Using too much alcohol can affect your balance and may make you more likely to have a fall. Follow these instructions at home: Lifestyle Do not drink alcohol if: Your health care provider tells you not to drink. If you drink alcohol: Limit how much you have to: 0-1 drink a day for women. 0-2 drinks a day for men. Know how much alcohol is in your drink. In the [place] one drink equals one 12 oz bottle of beer (355 mL), one 5 oz glass of wine (148 mL), or one 1 oz glass of hard liquor (44 mL). Do not use any products that contain nicotine or tobacco. These products include cigarettes, chewing tobacco, and vaping devices, such as e-cigarettes. If you need help quitting, ask your health care provider. Activity  Follow a regular exercise program to stay fit. This will help you maintain your balance. Ask your health care provider what types of exercise are appropriate for you. If you need a cane or walker, use it as recommended by your health care provider. Wear supportive shoes that have nonskid soles. Safety  Remove any tripping hazards, such as rugs, cords, and clutter. Install safety equipment such as grab bars in bathrooms and safety rails on stairs. Keep rooms and walkways   well-lit. General instructions Talk with your health care provider about your risks for falling. Tell your health care provider if: You fall. Be sure to tell your health care provider about all falls, even ones that seem minor. You feel dizzy, tiredness (fatigue), or off-balance. Take over-the-counter and prescription medicines only as told by your health care provider. These include  supplements. Eat a healthy diet and maintain a healthy weight. A healthy diet includes low-fat dairy products, low-fat (lean) meats, and fiber from whole grains, beans, and lots of fruits and vegetables. Stay current with your vaccines. Schedule regular health, dental, and eye exams. Summary Having a healthy lifestyle and getting preventive care can help to protect your health and wellness after age 74. Screening and testing are the best way to find a health problem early and help you avoid having a fall. Early diagnosis and treatment give you the best chance for managing medical conditions that are more common for people who are older than age 74. Falls are a major cause of broken bones and head injuries in people who are older than age 74. Take precautions to prevent a fall at home. Work with your health care provider to learn what changes you can make to improve your health and wellness and to prevent falls. This information is not intended to replace advice given to you by your health care provider. Make sure you discuss any questions you have with your health care provider. Document Revised: 08/31/2020 Document Reviewed: 08/31/2020 Elsevier Patient Education  2023 [employer]  

## 2022-03-02 NOTE — Progress Notes (Signed)
Thank you :)

## 2022-03-08 ENCOUNTER — Ambulatory Visit (INDEPENDENT_AMBULATORY_CARE_PROVIDER_SITE_OTHER): Payer: Medicare Other

## 2022-03-08 VITALS — BP 130/60 | HR 66 | Temp 97.9°F | Resp 16 | Ht 67.0 in | Wt 208.6 lb

## 2022-03-08 DIAGNOSIS — Z0000 Encounter for general adult medical examination without abnormal findings: Secondary | ICD-10-CM

## 2022-03-08 NOTE — Progress Notes (Signed)
Subjective:   Megan Gentry is a 76 y.o. female who presents for Medicare Annual (Subsequent) preventive examination.  Review of Systems     Cardiac Risk Factors include: advanced age (>6men, >61 women);dyslipidemia;hypertension;obesity (BMI >30kg/m2);family history of premature cardiovascular disease;diabetes mellitus     Objective:    Today's Vitals   03/08/22 1053  BP: 130/60  Pulse: 66  Resp: 16  Temp: 97.9 F (36.6 C)  TempSrc: Temporal  SpO2: 98%  Weight: 208 lb 9.6 oz (94.6 kg)  Height: 5\' 7"  (1.702 m)  PainSc: 0-No pain   Body mass index is 32.67 kg/m.     03/08/2022   11:13 AM 03/08/2022   10:54 AM  Advanced Directives  Does Patient Have a Medical Advance Directive? Yes Yes  Type of Paramedic of [place];Living will Out of facility DNR (pink MOST or yellow form)  Does patient want to make changes to medical advance directive?  No - Patient declined  Copy of [place] in Chart? No - copy requested     Current Medications (verified) Outpatient Encounter Medications as of 03/08/2022  Medication Sig   amLODipine (NORVASC) 10 MG tablet Take 10 mg by mouth daily.   fluticasone (FLONASE) 50 MCG/ACT nasal spray    glipiZIDE (GLUCOTROL) 5 MG tablet Take 1 tablet (5 mg total) by mouth daily before breakfast.   hydrALAZINE (APRESOLINE) 50 MG tablet Take 1 tablet (50 mg total) by mouth 2 (two) times daily.   latanoprost (XALATAN) 0.005 % ophthalmic solution    levothyroxine (SYNTHROID) 75 MCG tablet Take 1 tablet (75 mcg total) by mouth daily before breakfast.   metoprolol succinate (TOPROL-XL) 100 MG 24 hr tablet Take 100 mg by mouth daily.   Multiple Vitamin (MULTIVITAMIN) capsule Take 1 capsule by mouth daily.   olmesartan-hydrochlorothiazide (BENICAR HCT) 20-12.5 MG tablet Take 1 tablet by mouth daily.   pramoxine-hydrocortisone (PROCTOCREAM-HC) 1-1 % rectal cream Place 1 application rectally 2 (two) times daily.  2.5%   simvastatin (ZOCOR) 40 MG tablet Take 1 tablet (40 mg total) by mouth daily.   spironolactone (ALDACTONE) 25 MG tablet Take 1 tablet (25 mg total) by mouth daily.   valACYclovir (VALTREX) 1000 MG tablet Take 1,000 mg by mouth 2 (two) times daily. prn   No facility-administered encounter medications on file as of 03/08/2022.    Allergies (verified) Pyridium [phenazopyridine hcl] and Poison ivy extract   History: Past Medical History:  Diagnosis Date   Chronic kidney disease    Depression    Diabetes mellitus without complication (HCC)    Genital herpes    GERD (gastroesophageal reflux disease)    Heart murmur    Hyperlipidemia    Hypertension    Thyroid disease    History reviewed. No pertinent surgical history. Family History  Problem Relation Age of Onset   Stroke Mother    Hypertension Mother    Heart disease Mother    Heart disease Father    Alcohol abuse Father    Intellectual disability Sister    Hypertension Sister    Heart disease Sister    Hearing loss Sister    Hypertension Brother    Stroke Brother    Diabetes Brother    Arthritis Brother    Stroke Brother    Hypertension Brother    Heart disease Maternal Grandfather    Miscarriages / Stillbirths Daughter    Asthma Daughter    Heart disease Son    Sleep apnea Neg Hx  Social History   Socioeconomic History   Marital status: Divorced    Spouse name: Not on file   Number of children: Not on file   Years of education: Not on file   Highest education level: Not on file  Occupational History   Not on file  Tobacco Use   Smoking status: Former    Types: Cigarettes   Smokeless tobacco: Never  Substance and Sexual Activity   Alcohol use: Yes    Comment: occ   Drug use: Never   Sexual activity: Not Currently  Other Topics Concern   Not on file  Social History Narrative   Not on file   Social Determinants of Health   Financial Resource Strain: Low Risk  (03/08/2022)   Overall  Financial Resource Strain (CARDIA)    Difficulty of Paying Living Expenses: Not hard at all  Food Insecurity: No Food Insecurity (03/08/2022)   Hunger Vital Sign    Worried About Running Out of Food in the Last Year: Never true    [place] in the Last Year: Never true  Transportation Needs: No Transportation Needs (03/08/2022)   PRAPARE - Hydrologist (Medical): No    Lack of Transportation (Non-Medical): No  Physical Activity: Sufficiently Active (03/08/2022)   Exercise Vital Sign    Days of Exercise per Week: 7 days    Minutes of Exercise per Session: 30 min  Stress: No Stress Concern Present (03/08/2022)   [place]    Feeling of Stress : Not at all  Social Connections: [place] (03/08/2022)   Social Connection and Isolation Panel [NHANES]    Frequency of Communication with Friends and Family: More than three times a week    Frequency of Social Gatherings with Friends and Family: More than three times a week    Attends Religious Services: More than 4 times per year    Active Member of [employer] or Organizations: Yes    Attends Music therapist: More than 4 times per year    Marital Status: Married    Tobacco Counseling Counseling given: Not Answered   Clinical Intake:  Pre-visit preparation completed: Yes  Pain : No/denies pain Pain Score: 0-No pain     BMI - recorded: 32.67 Nutritional Status: BMI > 30  Obese Nutritional Risks: None Diabetes: No  How often do you need to have someone help you when you read instructions, pamphlets, or other written materials from your doctor or pharmacy?: 1 - Never What is the last grade level you completed in school?: HSG  Diabetic? prediabetic  Interpreter Needed?: No  Information entered by :: Lisette Abu, LPN.   Activities of Daily Living    03/08/2022   10:55 AM  In your present state of  health, do you have any difficulty performing the following activities:  Hearing? 0  Vision? 0  Difficulty concentrating or making decisions? 0  Walking or climbing stairs? 0  Dressing or bathing? 0  Doing errands, shopping? 0  Preparing Food and eating ? N  Using the Toilet? N  In the past six months, have you accidently leaked urine? N  Do you have problems with loss of bowel control? N  Managing your Medications? N  Managing your Finances? N  Housekeeping or managing your Housekeeping? N    Patient Care Team: Horald Pollen, MD as PCP - General (Internal Medicine) Melissa Noon, [place] as Referring Physician (Optometry)  Indicate any recent Medical Services you may have received from other than [facility] providers in the past year (date may be approximate).     Assessment:   This is a routine wellness examination for [facility].  Hearing/Vision screen Hearing Screening - Comments:: Denies hearing difficulties   Vision Screening - Comments:: Wears rx glasses - up to date with routine eye exams with Melissa Noon, OD.   Dietary issues and exercise activities discussed: Current Exercise Habits: Home exercise routine, Type of exercise: walking;Other - see comments (chair exercises, cubii, dancing, yard work), Time (Minutes): 30, Frequency (Times/Week): 7, Weekly Exercise (Minutes/Week): 210, Intensity: Moderate, Exercise limited by: None identified   Goals Addressed             This Visit's Progress    My goal is to maintain my health and stay independent.        Depression Screen    03/08/2022   10:53 AM 03/01/2022   11:17 AM 01/04/2022    1:24 PM 11/29/2021   11:24 AM 08/25/2021   11:01 AM  PHQ 2/9 Scores  PHQ - 2 Score 0 0 0 0 0    Fall Risk    03/08/2022   10:55 AM 03/01/2022   11:13 AM 01/04/2022    1:24 PM 11/29/2021   11:24 AM 08/25/2021   11:01 AM  Fall Risk   Falls in the past year? 0 0 0 0 0  Number falls in past yr: 0 0 0    Injury with Fall? 0 0 0    Risk for  fall due to : No Fall Risks No Fall Risks No Fall Risks    Follow up Falls prevention discussed Falls evaluation completed Falls evaluation completed      [place]:  Any stairs in or around the home? Yes  If so, are there any without handrails? No  Home free of loose throw rugs in walkways, pet beds, electrical cords, etc? Yes  Adequate lighting in your home to reduce risk of falls? Yes   ASSISTIVE DEVICES UTILIZED TO PREVENT FALLS:  Life alert? Yes  Use of a cane, walker or w/c? No  Grab bars in the bathroom? Yes  Shower chair or bench in shower? No  Elevated toilet seat or a handicapped toilet? No   TIMED UP AND GO:  Was the test performed? Yes .  Length of time to ambulate 10 feet: 8 sec.   Gait steady and fast without use of assistive device  Cognitive Function:        03/08/2022   10:55 AM  6CIT Screen  What Year? 0 points  What month? 0 points  What time? 0 points  Count back from 20 0 points  Months in reverse 0 points  Repeat phrase 0 points  Total Score 0 points    Immunizations Immunization History  Administered Date(s) Administered   Fluad Quad(high Dose 65+) 01/04/2022   Influenza Split 12/27/2019, 01/06/2021   Pneumococcal Conjugate-13 01/16/2014   Pneumococcal Polysaccharide-23 01/16/2014   Tdap 03/15/2019   Zoster Recombinat (Shingrix) 03/15/2019, 07/24/2019   Zoster, Live 08/02/2012, 03/15/2019, 07/24/2019    TDAP status: Up to date  Flu Vaccine status: Up to date  Pneumococcal vaccine status: Up to date  Covid-19 vaccine status: Declined, Education has been provided regarding the importance of this vaccine but patient still declined. Advised may receive this vaccine at local pharmacy or Health Dept.or vaccine clinic. Aware to provide a copy of the  vaccination record if obtained from local pharmacy or Health Dept. Verbalized acceptance and understanding.  Qualifies for Shingles Vaccine? Yes   Zostavax  completed Yes   Shingrix Completed?: Yes  Screening Tests Health Maintenance  Topic Date Due   COVID-19 Vaccine (1) Never done   FOOT EXAM  Never done   Hepatitis C Screening  Never done   COLONOSCOPY (Pts 45-79yrs Insurance coverage will need to be confirmed)  Never done   HEMOGLOBIN A1C  08/30/2022   OPHTHALMOLOGY EXAM  11/04/2022   Diabetic kidney evaluation - GFR measurement  03/02/2023   Diabetic kidney evaluation - Urine ACR  03/02/2023   Medicare Annual Wellness (AWV)  03/09/2023   MAMMOGRAM  12/09/2023   TETANUS/TDAP  03/14/2029   Pneumonia Vaccine 60+ Years old  Completed   INFLUENZA VACCINE  Completed   DEXA SCAN  Completed   Zoster Vaccines- Shingrix  Completed   HPV VACCINES  Aged Out    Health Maintenance  Health Maintenance Due  Topic Date Due   COVID-19 Vaccine (1) Never done   FOOT EXAM  Never done   Hepatitis C Screening  Never done   COLONOSCOPY (Pts 45-32yrs Insurance coverage will need to be confirmed)  Never done    Colorectal cancer screening: Type of screening: Cologuard. Completed 06/10/2020. Repeat every 3 years  Mammogram status: Completed 11/23/2021. Repeat every year  Bone Density status: Completed 04/03/2019. Results reflect: Bone density results: NORMAL. Repeat every 5 years.  Lung Cancer Screening: (Low Dose CT Chest recommended if Age 29-80 years, 30 pack-year currently smoking OR have quit w/in 15years.) does not qualify.   Lung Cancer Screening Referral: no  Additional Screening:  Hepatitis C Screening: does not qualify; Completed no  Vision Screening: Recommended annual ophthalmology exams for early detection of glaucoma and other disorders of the eye. Is the patient up to date with their annual eye exam?  Yes  Who is the provider or what is the name of the office in which the patient attends annual eye exams? Melissa Noon, OD. If pt is not established with a provider, would they like to be referred to a provider to establish care? No .    Dental Screening: Recommended annual dental exams for proper oral hygiene  Community Resource Referral / Chronic Care Management: CRR required this visit?  No   CCM required this visit?  No      Plan:     I have personally reviewed and noted the following in the patient's chart:   Medical and social history Use of alcohol, tobacco or illicit drugs  Current medications and supplements including opioid prescriptions. Patient is not currently taking opioid prescriptions. Functional ability and status Nutritional status Physical activity Advanced directives List of other physicians Hospitalizations, surgeries, and ER visits in previous 12 months Vitals Screenings to include cognitive, depression, and falls Referrals and appointments  In addition, I have reviewed and discussed with patient certain preventive protocols, quality metrics, and best practice recommendations. A written personalized care plan for preventive services as well as general preventive health recommendations were provided to patient.     Sheral Flow, LPN   [id number]   Nurse Notes: N/A

## 2022-03-08 NOTE — Patient Instructions (Signed)
[name] , Thank you for taking time to come for your Medicare Wellness Visit. I appreciate your ongoing commitment to your health goals. Please review the following plan we discussed and let me know if I can assist you in the future.   These are the goals we discussed:  Goals      My goal is to maintain my health and stay independent.        This is a list of the screening recommended for you and due dates:  Health Maintenance  Topic Date Due   COVID-19 Vaccine (1) Never done   Complete foot exam   Never done   Hepatitis C Screening: USPSTF Recommendation to screen - Ages 44-79 yo.  Never done   Colon Cancer Screening  Never done   Medicare Annual Wellness Visit  03/26/2022   Hemoglobin A1C  08/30/2022   Eye exam for diabetics  11/04/2022   Yearly kidney function blood test for diabetes  03/02/2023   Yearly kidney health urinalysis for diabetes  03/02/2023   Mammogram  12/09/2023   Tetanus Vaccine  03/14/2029   Pneumonia Vaccine  Completed   Flu Shot  Completed   DEXA scan (bone density measurement)  Completed   Zoster (Shingles) Vaccine  Completed   HPV Vaccine  Aged Out    Advanced directives: Yes  Conditions/risks identified: Yes  Next appointment: Follow up in one year for your annual wellness visit.   Preventive Care 74 Years and Older, Female Preventive care refers to lifestyle choices and visits with your health care provider that can promote health and wellness. What does preventive care include? A yearly physical exam. This is also called an annual well check. Dental exams once or twice a year. Routine eye exams. Ask your health care provider how often you should have your eyes checked. Personal lifestyle choices, including: Daily care of your teeth and gums. Regular physical activity. Eating a healthy diet. Avoiding tobacco and drug use. Limiting alcohol use. Practicing safe sex. Taking low-dose aspirin every day. Taking vitamin and mineral  supplements as recommended by your health care provider. What happens during an annual well check? The services and screenings done by your health care provider during your annual well check will depend on your age, overall health, lifestyle risk factors, and family history of disease. Counseling  Your health care provider may ask you questions about your: Alcohol use. Tobacco use. Drug use. Emotional well-being. Home and relationship well-being. Sexual activity. Eating habits. History of falls. Memory and ability to understand (cognition). Work and work Statistician. Reproductive health. Screening  You may have the following tests or measurements: Height, weight, and BMI. Blood pressure. Lipid and cholesterol levels. These may be checked every 5 years, or more frequently if you are over 32 years old. Skin check. Lung cancer screening. You may have this screening every year starting at age 35 if you have a 30-pack-year history of smoking and currently smoke or have quit within the past 15 years. Fecal occult blood test (FOBT) of the stool. You may have this test every year starting at age 36. Flexible sigmoidoscopy or colonoscopy. You may have a sigmoidoscopy every 5 years or a colonoscopy every 10 years starting at age 84. Hepatitis C blood test. Hepatitis B blood test. Sexually transmitted disease (STD) testing. Diabetes screening. This is done by checking your blood sugar (glucose) after you have not eaten for a while (fasting). You may have this done every 1-3 years. Bone density scan. This  is done to screen for osteoporosis. You may have this done starting at age 92. Mammogram. This may be done every 1-2 years. Talk to your health care provider about how often you should have regular mammograms. Talk with your health care provider about your test results, treatment options, and if necessary, the need for more tests. Vaccines  Your health care provider may recommend certain  vaccines, such as: Influenza vaccine. This is recommended every year. Tetanus, diphtheria, and acellular pertussis (Tdap, Td) vaccine. You may need a Td booster every 10 years. Zoster vaccine. You may need this after age 74. Pneumococcal 13-valent conjugate (PCV13) vaccine. One dose is recommended after age 74. Pneumococcal polysaccharide (PPSV23) vaccine. One dose is recommended after age 5. Talk to your health care provider about which screenings and vaccines you need and how often you need them. This information is not intended to replace advice given to you by your health care provider. Make sure you discuss any questions you have with your health care provider. Document Released: 05/08/2015 Document Revised: 12/30/2015 Document Reviewed: 02/10/2015 Elsevier Interactive Patient Education  2017 [place] Prevention in the Home Falls can cause injuries. They can happen to people of all ages. There are many things you can do to make your home safe and to help prevent falls. What can I do on the outside of my home? Regularly fix the edges of walkways and driveways and fix any cracks. Remove anything that might make you trip as you walk through a door, such as a raised step or threshold. Trim any bushes or trees on the path to your home. Use bright outdoor lighting. Clear any walking paths of anything that might make someone trip, such as rocks or tools. Regularly check to see if handrails are loose or broken. Make sure that both sides of any steps have handrails. Any raised decks and porches should have guardrails on the edges. Have any leaves, snow, or ice cleared regularly. Use sand or salt on walking paths during winter. Clean up any spills in your garage right away. This includes oil or grease spills. What can I do in the bathroom? Use night lights. Install grab bars by the toilet and in the tub and shower. Do not use towel bars as grab bars. Use non-skid mats or decals in  the tub or shower. If you need to sit down in the shower, use a plastic, non-slip stool. Keep the floor dry. Clean up any water that spills on the floor as soon as it happens. Remove soap buildup in the tub or shower regularly. Attach bath mats securely with double-sided non-slip rug tape. Do not have throw rugs and other things on the floor that can make you trip. What can I do in the bedroom? Use night lights. Make sure that you have a light by your bed that is easy to reach. Do not use any sheets or blankets that are too big for your bed. They should not hang down onto the floor. Have a firm chair that has side arms. You can use this for support while you get dressed. Do not have throw rugs and other things on the floor that can make you trip. What can I do in the kitchen? Clean up any spills right away. Avoid walking on wet floors. Keep items that you use a lot in easy-to-reach places. If you need to reach something above you, use a strong step stool that has a grab bar. Keep electrical cords out  of the way. Do not use floor polish or wax that makes floors slippery. If you must use wax, use non-skid floor wax. Do not have throw rugs and other things on the floor that can make you trip. What can I do with my stairs? Do not leave any items on the stairs. Make sure that there are handrails on both sides of the stairs and use them. Fix handrails that are broken or loose. Make sure that handrails are as long as the stairways. Check any carpeting to make sure that it is firmly attached to the stairs. Fix any carpet that is loose or worn. Avoid having throw rugs at the top or bottom of the stairs. If you do have throw rugs, attach them to the floor with carpet tape. Make sure that you have a light switch at the top of the stairs and the bottom of the stairs. If you do not have them, ask someone to add them for you. What else can I do to help prevent falls? Wear shoes that: Do not have high  heels. Have rubber bottoms. Are comfortable and fit you well. Are closed at the toe. Do not wear sandals. If you use a stepladder: Make sure that it is fully opened. Do not climb a closed stepladder. Make sure that both sides of the stepladder are locked into place. Ask someone to hold it for you, if possible. Clearly mark and make sure that you can see: Any grab bars or handrails. First and last steps. Where the edge of each step is. Use tools that help you move around (mobility aids) if they are needed. These include: Canes. Walkers. Scooters. Crutches. Turn on the lights when you go into a dark area. Replace any light bulbs as soon as they burn out. Set up your furniture so you have a clear path. Avoid moving your furniture around. If any of your floors are uneven, fix them. If there are any pets around you, be aware of where they are. Review your medicines with your doctor. Some medicines can make you feel dizzy. This can increase your chance of falling. Ask your doctor what other things that you can do to help prevent falls. This information is not intended to replace advice given to you by your health care provider. Make sure you discuss any questions you have with your health care provider. Document Released: 02/05/2009 Document Revised: 09/17/2015 Document Reviewed: 05/16/2014 Elsevier Interactive Patient Education  2017 [employer].

## 2022-03-15 DIAGNOSIS — N184 Chronic kidney disease, stage 4 (severe): Secondary | ICD-10-CM | POA: Diagnosis not present

## 2022-03-15 DIAGNOSIS — E1122 Type 2 diabetes mellitus with diabetic chronic kidney disease: Secondary | ICD-10-CM | POA: Diagnosis not present

## 2022-03-15 DIAGNOSIS — D649 Anemia, unspecified: Secondary | ICD-10-CM | POA: Diagnosis not present

## 2022-03-15 DIAGNOSIS — I129 Hypertensive chronic kidney disease with stage 1 through stage 4 chronic kidney disease, or unspecified chronic kidney disease: Secondary | ICD-10-CM | POA: Diagnosis not present

## 2022-03-15 DIAGNOSIS — N281 Cyst of kidney, acquired: Secondary | ICD-10-CM | POA: Diagnosis not present

## 2022-03-29 DIAGNOSIS — N184 Chronic kidney disease, stage 4 (severe): Secondary | ICD-10-CM | POA: Diagnosis not present

## 2022-04-01 ENCOUNTER — Telehealth: Payer: Self-pay | Admitting: Emergency Medicine

## 2022-04-01 DIAGNOSIS — E785 Hyperlipidemia, unspecified: Secondary | ICD-10-CM

## 2022-04-01 MED ORDER — SIMVASTATIN 40 MG PO TABS: 40.0000 mg | ORAL_TABLET | Freq: Every day | ORAL | 3 refills | Status: DC

## 2022-04-01 NOTE — Telephone Encounter (Signed)
Patient states she called [facility] for her medicine and they stated she needs a new prescription sent in for her levothyroxine (Synthroid) 46mcg and simvastatin (Zocor) 40mg .

## 2022-04-01 NOTE — Telephone Encounter (Signed)
Called patient and mage her aware that her levothyroxine was sent to [facility] back in Sept for a year supply, I have sent over a new rx for her simvastatin to her pharmacy.

## 2022-05-04 ENCOUNTER — Telehealth: Payer: Self-pay | Admitting: *Deleted

## 2022-05-04 MED ORDER — METOPROLOL SUCCINATE ER 100 MG PO TB24: 100.0000 mg | ORAL_TABLET | Freq: Every day | ORAL | 3 refills | Status: DC

## 2022-05-04 MED ORDER — AMLODIPINE BESYLATE 10 MG PO TABS: 10.0000 mg | ORAL_TABLET | Freq: Every day | ORAL | 3 refills | Status: DC

## 2022-05-04 NOTE — Telephone Encounter (Signed)
New rx sent to patient requested pharmacy  

## 2022-05-04 NOTE — Telephone Encounter (Signed)
Please advise on refills, patient is requesting a refill of amlodipine and Metoprolol, previous rx was sent by another provider.

## 2022-05-04 NOTE — Telephone Encounter (Signed)
Okay to refill? 

## 2022-05-09 DIAGNOSIS — H401131 Primary open-angle glaucoma, bilateral, mild stage: Secondary | ICD-10-CM | POA: Diagnosis not present

## 2022-05-09 DIAGNOSIS — H524 Presbyopia: Secondary | ICD-10-CM | POA: Diagnosis not present

## 2022-05-09 DIAGNOSIS — H5203 Hypermetropia, bilateral: Secondary | ICD-10-CM | POA: Diagnosis not present

## 2022-05-09 DIAGNOSIS — E119 Type 2 diabetes mellitus without complications: Secondary | ICD-10-CM | POA: Diagnosis not present

## 2022-05-09 DIAGNOSIS — H2513 Age-related nuclear cataract, bilateral: Secondary | ICD-10-CM | POA: Diagnosis not present

## 2022-06-08 DIAGNOSIS — E039 Hypothyroidism, unspecified: Secondary | ICD-10-CM | POA: Diagnosis not present

## 2022-06-08 DIAGNOSIS — E785 Hyperlipidemia, unspecified: Secondary | ICD-10-CM | POA: Diagnosis not present

## 2022-06-08 DIAGNOSIS — N185 Chronic kidney disease, stage 5: Secondary | ICD-10-CM | POA: Diagnosis not present

## 2022-06-08 DIAGNOSIS — E1169 Type 2 diabetes mellitus with other specified complication: Secondary | ICD-10-CM | POA: Diagnosis not present

## 2022-06-08 DIAGNOSIS — I10 Essential (primary) hypertension: Secondary | ICD-10-CM | POA: Diagnosis not present

## 2022-06-22 DIAGNOSIS — N185 Chronic kidney disease, stage 5: Secondary | ICD-10-CM | POA: Diagnosis not present

## 2022-06-22 DIAGNOSIS — I10 Essential (primary) hypertension: Secondary | ICD-10-CM | POA: Diagnosis not present

## 2022-06-22 DIAGNOSIS — E1169 Type 2 diabetes mellitus with other specified complication: Secondary | ICD-10-CM | POA: Diagnosis not present

## 2022-06-27 DIAGNOSIS — N281 Cyst of kidney, acquired: Secondary | ICD-10-CM | POA: Diagnosis not present

## 2022-06-27 DIAGNOSIS — N184 Chronic kidney disease, stage 4 (severe): Secondary | ICD-10-CM | POA: Diagnosis not present

## 2022-06-27 DIAGNOSIS — E1122 Type 2 diabetes mellitus with diabetic chronic kidney disease: Secondary | ICD-10-CM | POA: Diagnosis not present

## 2022-06-27 DIAGNOSIS — D649 Anemia, unspecified: Secondary | ICD-10-CM | POA: Diagnosis not present

## 2022-06-27 DIAGNOSIS — I129 Hypertensive chronic kidney disease with stage 1 through stage 4 chronic kidney disease, or unspecified chronic kidney disease: Secondary | ICD-10-CM | POA: Diagnosis not present

## 2022-06-30 DIAGNOSIS — B351 Tinea unguium: Secondary | ICD-10-CM | POA: Diagnosis not present

## 2022-06-30 DIAGNOSIS — I739 Peripheral vascular disease, unspecified: Secondary | ICD-10-CM | POA: Diagnosis not present

## 2022-06-30 DIAGNOSIS — M205X2 Other deformities of toe(s) (acquired), left foot: Secondary | ICD-10-CM | POA: Diagnosis not present

## 2022-06-30 DIAGNOSIS — L609 Nail disorder, unspecified: Secondary | ICD-10-CM | POA: Diagnosis not present

## 2022-06-30 DIAGNOSIS — M7989 Other specified soft tissue disorders: Secondary | ICD-10-CM | POA: Diagnosis not present

## 2022-06-30 DIAGNOSIS — L851 Acquired keratosis [keratoderma] palmaris et plantaris: Secondary | ICD-10-CM | POA: Diagnosis not present

## 2022-06-30 DIAGNOSIS — E1142 Type 2 diabetes mellitus with diabetic polyneuropathy: Secondary | ICD-10-CM | POA: Diagnosis not present

## 2022-06-30 DIAGNOSIS — M205X1 Other deformities of toe(s) (acquired), right foot: Secondary | ICD-10-CM | POA: Diagnosis not present

## 2022-07-07 DIAGNOSIS — L601 Onycholysis: Secondary | ICD-10-CM | POA: Diagnosis not present

## 2022-07-19 DIAGNOSIS — M205X2 Other deformities of toe(s) (acquired), left foot: Secondary | ICD-10-CM | POA: Diagnosis not present

## 2022-07-19 DIAGNOSIS — L602 Onychogryphosis: Secondary | ICD-10-CM | POA: Diagnosis not present

## 2022-07-19 DIAGNOSIS — M205X1 Other deformities of toe(s) (acquired), right foot: Secondary | ICD-10-CM | POA: Diagnosis not present

## 2022-08-30 ENCOUNTER — Ambulatory Visit: Payer: Medicare Other | Admitting: Emergency Medicine

## 2022-09-20 DIAGNOSIS — E039 Hypothyroidism, unspecified: Secondary | ICD-10-CM | POA: Diagnosis not present

## 2022-09-20 DIAGNOSIS — N185 Chronic kidney disease, stage 5: Secondary | ICD-10-CM | POA: Diagnosis not present

## 2022-09-20 DIAGNOSIS — I10 Essential (primary) hypertension: Secondary | ICD-10-CM | POA: Diagnosis not present

## 2022-09-20 DIAGNOSIS — E1169 Type 2 diabetes mellitus with other specified complication: Secondary | ICD-10-CM | POA: Diagnosis not present

## 2022-09-20 DIAGNOSIS — G4730 Sleep apnea, unspecified: Secondary | ICD-10-CM | POA: Diagnosis not present

## 2022-09-20 DIAGNOSIS — E785 Hyperlipidemia, unspecified: Secondary | ICD-10-CM | POA: Diagnosis not present

## 2022-10-03 DIAGNOSIS — M810 Age-related osteoporosis without current pathological fracture: Secondary | ICD-10-CM | POA: Diagnosis not present

## 2022-10-03 DIAGNOSIS — E119 Type 2 diabetes mellitus without complications: Secondary | ICD-10-CM | POA: Diagnosis not present

## 2022-10-14 ENCOUNTER — Encounter: Payer: Self-pay | Admitting: Neurology

## 2022-10-17 NOTE — Telephone Encounter (Signed)
Zott, Daylene Katayama, Fermin Schwab, RN; Zott, Ermalinda Memos, [place] I will have a RT give her a call to troubleshoot her machine.       Previous Messages    ----- Message ----- From: Guy Begin, RN Sent: 10/17/2022   1:00 PM EDT To: Melvern Sample; Stacy Zott Subject: machine making loud noises                    This is a message we received from pt.     it is making the loud noise and breathing loudly with me as I breathe again. I have also experienced dizziness upon putting the nasal cap on while lying down. The dizziness lasted all day for 2 days after each nightly use. I did not use the machine last night, 10-13-2022, and I have not experienced any more dizziness. I was wondering if I should continue use.  My next appointment is Aug. 7, 2024 at 9:45 am.   I have tried to contact [facility] for supplies I need and their line is constantly busy.  The website will not allow me to order what I need without ordering everything. But I will keep trying.   This message sent mainly about machine (not the dizziness).  Are you all able to address the machine issue?    Delmer Islam

## 2022-10-17 NOTE — Telephone Encounter (Signed)
I called pt and let her know that RT will be reaching out.  She did confirm the # to [facility] [phone]. She also was having issues with website on ordering supplies.  She used [employer] and that seemed to assist in her using the machine and no dizziness.  She appreciated call back.

## 2022-10-24 DIAGNOSIS — D123 Benign neoplasm of transverse colon: Secondary | ICD-10-CM | POA: Diagnosis not present

## 2022-10-24 DIAGNOSIS — K640 First degree hemorrhoids: Secondary | ICD-10-CM | POA: Diagnosis not present

## 2022-10-24 DIAGNOSIS — Z1211 Encounter for screening for malignant neoplasm of colon: Secondary | ICD-10-CM | POA: Diagnosis not present

## 2022-10-24 DIAGNOSIS — K644 Residual hemorrhoidal skin tags: Secondary | ICD-10-CM | POA: Diagnosis not present

## 2022-10-24 DIAGNOSIS — D122 Benign neoplasm of ascending colon: Secondary | ICD-10-CM | POA: Diagnosis not present

## 2022-10-26 ENCOUNTER — Other Ambulatory Visit: Payer: Self-pay | Admitting: Family Medicine

## 2022-10-26 DIAGNOSIS — Z1231 Encounter for screening mammogram for malignant neoplasm of breast: Secondary | ICD-10-CM

## 2022-10-28 DIAGNOSIS — D123 Benign neoplasm of transverse colon: Secondary | ICD-10-CM | POA: Diagnosis not present

## 2022-10-31 DIAGNOSIS — N185 Chronic kidney disease, stage 5: Secondary | ICD-10-CM | POA: Diagnosis not present

## 2022-10-31 DIAGNOSIS — E039 Hypothyroidism, unspecified: Secondary | ICD-10-CM | POA: Diagnosis not present

## 2022-10-31 DIAGNOSIS — E1169 Type 2 diabetes mellitus with other specified complication: Secondary | ICD-10-CM | POA: Diagnosis not present

## 2022-10-31 DIAGNOSIS — I10 Essential (primary) hypertension: Secondary | ICD-10-CM | POA: Diagnosis not present

## 2022-11-07 ENCOUNTER — Other Ambulatory Visit: Payer: Self-pay | Admitting: Emergency Medicine

## 2022-11-07 DIAGNOSIS — E039 Hypothyroidism, unspecified: Secondary | ICD-10-CM

## 2022-11-10 DIAGNOSIS — N184 Chronic kidney disease, stage 4 (severe): Secondary | ICD-10-CM | POA: Diagnosis not present

## 2022-11-10 DIAGNOSIS — I129 Hypertensive chronic kidney disease with stage 1 through stage 4 chronic kidney disease, or unspecified chronic kidney disease: Secondary | ICD-10-CM | POA: Diagnosis not present

## 2022-11-10 DIAGNOSIS — D649 Anemia, unspecified: Secondary | ICD-10-CM | POA: Diagnosis not present

## 2022-11-10 DIAGNOSIS — N281 Cyst of kidney, acquired: Secondary | ICD-10-CM | POA: Diagnosis not present

## 2022-11-10 DIAGNOSIS — E1122 Type 2 diabetes mellitus with diabetic chronic kidney disease: Secondary | ICD-10-CM | POA: Diagnosis not present

## 2022-11-14 DIAGNOSIS — E785 Hyperlipidemia, unspecified: Secondary | ICD-10-CM | POA: Diagnosis not present

## 2022-11-14 DIAGNOSIS — E039 Hypothyroidism, unspecified: Secondary | ICD-10-CM | POA: Diagnosis not present

## 2022-11-14 DIAGNOSIS — E1169 Type 2 diabetes mellitus with other specified complication: Secondary | ICD-10-CM | POA: Diagnosis not present

## 2022-11-14 DIAGNOSIS — G4730 Sleep apnea, unspecified: Secondary | ICD-10-CM | POA: Diagnosis not present

## 2022-11-14 DIAGNOSIS — N185 Chronic kidney disease, stage 5: Secondary | ICD-10-CM | POA: Diagnosis not present

## 2022-11-14 DIAGNOSIS — I10 Essential (primary) hypertension: Secondary | ICD-10-CM | POA: Diagnosis not present

## 2022-11-17 DIAGNOSIS — H401131 Primary open-angle glaucoma, bilateral, mild stage: Secondary | ICD-10-CM | POA: Diagnosis not present

## 2022-11-24 ENCOUNTER — Ambulatory Visit: Payer: Medicare Other | Admitting: Neurology

## 2022-11-30 ENCOUNTER — Ambulatory Visit (INDEPENDENT_AMBULATORY_CARE_PROVIDER_SITE_OTHER): Payer: Medicare Other | Admitting: Neurology

## 2022-11-30 ENCOUNTER — Encounter: Payer: Self-pay | Admitting: Neurology

## 2022-11-30 VITALS — BP 136/73 | HR 67 | Ht 67.0 in | Wt 205.0 lb

## 2022-11-30 DIAGNOSIS — Z789 Other specified health status: Secondary | ICD-10-CM | POA: Diagnosis not present

## 2022-11-30 DIAGNOSIS — G4733 Obstructive sleep apnea (adult) (pediatric): Secondary | ICD-10-CM

## 2022-11-30 NOTE — Patient Instructions (Signed)
It was nice to see you again today.   As discussed, we will proceed with a home sleep test (HST) to re-establish your sleep apnea diagnosis and to get you a new machine. Our sleep lab staff will reach out to you to arrange for pickup and for tutorial of your test equipment - you will do the test at home that night and bring the test sensors back for data analysis the next day or whenever you are scheduled for drop off of your test equipment. I will write for a new machine after your HST confirms your obstructive sleep apnea diagnosis.   Please remember, you will not use your autoPAP the night of your testing.  This is so we get diagnostic data, we do not need treatment data.    Most people who are very consistent with their AutoPap or CPAP use do not sleep very well without the machine - this is understandable, but as long as we have just sleep data that confirms your sleep apnea diagnosis we should be good.   We will schedule a follow-up appointment after set up with your new machine, typically within 31 to 89 days post treatment start. You will need to show compliance with usage and fulfill a minimum usage percentage (this is an insurance requirement).  After you have done your home sleep test, you can resume using your current machine until you get a new one.   Please continue using your autoPAP regularly. While your insurance requires that you use CPAP at least 4 hours each night on 70% of the nights, I recommend, that you not skip any nights and use it throughout the night if you can. Getting used to CPAP and staying with the treatment long term does take time and patience and discipline. Untreated obstructive sleep apnea when it is moderate to severe can have an adverse impact on cardiovascular health and raise her risk for heart disease, arrhythmias, hypertension, congestive heart failure, stroke and diabetes. Untreated obstructive sleep apnea causes sleep disruption, nonrestorative sleep, and sleep  deprivation. This can have an impact on your day to day functioning and cause daytime sleepiness and impairment of cognitive function, memory loss, mood disturbance, and problems focussing. Using CPAP regularly can improve these symptoms.

## 2022-11-30 NOTE — Progress Notes (Signed)
Subjective:    Patient ID: Megan Gentry is a 77 y.o. female.  HPI    Interim history:   Megan Gentry is a 77 year old right-handed woman with an underlying medical history of hypertension, hyperlipidemia, thyroid disease, reflux disease, diabetes, chronic kidney disease, depression, heart murmur, and mild obesity, who presents for follow-up titration of her obstructive sleep apnea, established on CPAP therapy. The patient is unaccompanied today. I first met her on 11/23/2021 at the request of her primary care physician, which time she reported a prior diagnosis of OSA, and she was on AutoPap therapy. She had originally been diagnosed with sleep apnea in or around 2012 and had a second sleep study in [place] in 2017 which showed an AHI of 8.8/h, REM AHI of 23.2/h, supine AHI 13.3/h, O2 nadir 82%.  She was compliant with her AutoPap of 5 to 15 cm.  Her apnea scores were at goal.  She was encouraged to follow-up routinely in 1 year.    Today: 11/30/2022: I reviewed her AutoPap compliance data from 10/29/2022 through 11/27/2022, which is a total of 30 days, during which time she used her machine 28 days with percent usage days greater than 4 hours at 93%, indicating excellent compliance with an average usage of 6 hours and 58 minutes 4 days on treatment, residual AHI at goal at 1.2/h, 95th percentile pressure at 9.4 cm with a range of 5 to 15 cm with EPR of 3.  Leak on the higher side with a 95th percentile at 22.3 L/min.  She reports generally doing okay with the machine, but the noise from it bothers her and even wakes her up in the middle of the night, she feels like she hears someone else breathes.  She took the machine into her DME provider and they heard the noise as well and told her that her best option would be to get a replacement as she is eligible for new machine after nearly 7 years.  She uses nasal pillows and is generally up-to-date with supplies.  She takes several blood pressure  medications.  She tries to hydrate well with water.  The patient's allergies, current medications, family history, past medical history, past social history, past surgical history and problem list were reviewed and updated as appropriate.   Previously:   11/23/21: (She) was previously diagnosed with obstructive sleep apnea and placed on PAP therapy.  I reviewed your office note from 08/25/2021.  Prior sleep study results are not available for my review today.  Testing was in the [place], about 5 years ago.  She originally was diagnosed with sleep apnea in 2012 and in 2018 she got her second machine which she has a ResMed air sense 10 machine.  I was able to review her compliance data for the past 3 months, she used her machine 79 out of 90 days with percent use days greater than 4 hours at 88%, indicating very good compliance with an average usage of 7 hours and 22 minutes, residual AHI at goal at 1.2/h, average pressure for the 95th percentile at 8.9 cm with a range of 5 to 15 cm with EPR.  Leak on the higher side with the 95th percentile at 20.1 L/min.  She reports that she has benefited from treatment over time.  She is compliant with treatment and moved from the [place] area where she stayed with her son to [place].  She wants to be in between her kids.  She has a daughter in [place].  Patient  is originally from [place] but went to college in [place].  Her daughter has 7 children and her son has 6 children and the patient has 15 great-grandchildren.  She has her sister who is 57 years old with early dementia with her, sister moved from [place].  Patient is the youngest of 5 kids, she is 2 sisters and had 2 brothers, both brothers are deceased.  The patient is divorced, she has no pets in the household, she goes to bed around 11:30 PM and rise time is around 7:30 AM.  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  She has had some weight fluctuation within 5 to 10 pounds.   She drinks caffeine all of coffee, typically 1 cup and it is typically decaf.  She drinks alcohol very occasionally, quit smoking in 1991.  Her DME company is [employer] but she would like to switch to a different supplier.  She has been using a so clean machine.  She uses a nasal mask.  She is up-to-date with her supplies currently.  Her Epworth sleepiness score is 9 out of 24, fatigue severity score is 13 out of 63.  She has nocturia about once or twice per average night, she denies recurrent morning headaches.   Addendum, 01/19/2022: I received her sleep study report from [facility] sleep medicine.  She had a sleep study on 01/23/2016.  Sleep latency was 11 minutes, sleep efficiency 81.5%, REM latency 77.5 minutes.  Wake after sleep onset was 73 minutes, she had an increased percentage of stage II sleep at 66.1%.  Total AHI was 8.8/h, REM AHI 23.2/h, supine AHI 13.3/h.  She had no significant PLM's.  Average oxygen saturation was 89% for mean oxygen, minimum was 82, time below 88% saturation was 31.2 minutes.     Her Past Medical History Is Significant For: Past Medical History:  Diagnosis Date   Chronic kidney disease    Depression    Diabetes mellitus without complication (HCC)    Genital herpes    GERD (gastroesophageal reflux disease)    Heart murmur    Hyperlipidemia    Hypertension    Thyroid disease     Her Past Surgical History Is Significant For: History reviewed. No pertinent surgical history.  Her Family History Is Significant For: Family History  Problem Relation Age of Onset   Stroke Mother    Hypertension Mother    Heart disease Mother    Heart disease Father    Alcohol abuse Father    Intellectual disability Sister    Hypertension Sister    Heart disease Sister    Hearing loss Sister    Hypertension Brother    Stroke Brother    Diabetes Brother    Arthritis Brother    Stroke Brother    Hypertension Brother    Heart disease Maternal Grandfather     Miscarriages / Stillbirths Daughter    Asthma Daughter    Heart disease Son    Sleep apnea Neg Hx     Her Social History Is Significant For: Social History   Socioeconomic History   Marital status: Divorced    Spouse name: Not on file   Number of children: Not on file   Years of education: Not on file   Highest education level: Not on file  Occupational History   Not on file  Tobacco Use   Smoking status: Former    Types: Cigarettes   Smokeless tobacco: Never  Substance and Sexual Activity   Alcohol use: Yes    Comment: occ   Drug use: Never   Sexual activity: Not Currently  Other Topics Concern   Not on file  Social History Narrative   Not on file   Social Determinants of Health   Financial Resource Strain: Low Risk  (03/08/2022)   Overall Financial Resource Strain (CARDIA)    Difficulty of Paying Living Expenses: Not hard at all  Food Insecurity: No Food Insecurity (03/08/2022)   Hunger Vital Sign    Worried About Running Out of Food in the Last Year: Never true    Ran Out of Food in the Last Year: Never true  Transportation Needs: No Transportation Needs (03/08/2022)   PRAPARE - Administrator, Civil Service (Medical): No    Lack of Transportation (Non-Medical): No  Physical Activity: Sufficiently Active (03/08/2022)   Exercise Vital Sign    Days of Exercise per Week: 7 days    Minutes of Exercise per Session: 30 min  Stress: No Stress Concern Present (03/08/2022)   [employer] of Occupational Health - Occupational Stress Questionnaire    Feeling of Stress : Not at all  Social Connections: Socially Integrated (03/08/2022)   Social Connection and Isolation Panel [NHANES]    Frequency of Communication with Friends and Family: More than three times a week    Frequency of Social Gatherings with Friends and Family: More than three times a week    Attends Religious Services: More than 4 times per year    Active Member of [employer] or Organizations:  Yes    Attends Engineer, structural: More than 4 times per year    Marital Status: Married    Her Allergies Are:  Allergies  Allergen Reactions   Pyridium [Phenazopyridine Hcl] Other (See Comments)    Severe lower back pain    Poison Ivy Extract Other (See Comments)    Swelling of face and eyes  :   Her Current Medications Are:  Outpatient Encounter Medications as of 11/30/2022  Medication Sig   amLODipine (NORVASC) 10 MG tablet Take 1 tablet (10 mg total) by mouth daily.   Cholecalciferol (VITAMIN D3) 50 MCG (2000 UT) capsule Take 2,000 Units by mouth daily.   fluticasone (FLONASE) 50 MCG/ACT nasal spray    glipiZIDE (GLUCOTROL) 5 MG tablet Take 1 tablet (5 mg total) by mouth daily before breakfast.   hydrALAZINE (APRESOLINE) 50 MG tablet Take 1 tablet (50 mg total) by mouth 2 (two) times daily.   latanoprost (XALATAN) 0.005 % ophthalmic solution    levothyroxine (SYNTHROID) 75 MCG tablet TAKE 1 TABLET EVERY DAY BEFORE BREAKFAST   metoprolol succinate (TOPROL-XL) 100 MG 24 hr tablet Take 1 tablet (100 mg total) by mouth daily.   Multiple Vitamin (MULTIVITAMIN) capsule Take 1 capsule by mouth daily.   olmesartan-hydrochlorothiazide (BENICAR HCT) 20-12.5 MG tablet Take 1 tablet by mouth daily.   pramoxine-hydrocortisone (PROCTOCREAM-HC) 1-1 % rectal cream Place 1 application rectally 2 (two) times daily. 2.5%   simvastatin (ZOCOR) 40 MG tablet Take 1 tablet (40 mg total) by mouth daily.   spironolactone (ALDACTONE) 25 MG tablet Take 1 tablet (25 mg total) by mouth daily.   valACYclovir (VALTREX) 1000 MG tablet Take 1,000 mg by mouth 2 (two) times daily. prn   vitamin E 180 MG (400 UNITS) capsule Take 400 Units by mouth daily.   No facility-administered encounter medications on file as of 11/30/2022.  :  Review of Systems:  Out of a complete 14 point review of systems, all are reviewed and negative with the exception of these symptoms as listed below:  Review of Systems   Neurological:        Rm 4 alone Pt is well, reports she is having some problems with CPAP being loud. Overall she is stable, no other concerns.     Objective:  Neurological Exam  Physical Exam Physical Examination:   Vitals:   11/30/22 0956  BP: 136/73  Pulse: 67    General Examination: The patient is a very pleasant 77 y.o. female in no acute distress. She appears well-developed and well-nourished and well groomed.   HEENT: Normocephalic, atraumatic, pupils are equal, round and reactive to light, corrective eyeglasses in place.  Extraocular tracking is good without limitation to gaze excursion or nystagmus noted. Hearing is grossly intact. Face is symmetric with normal facial animation. Speech is clear with no dysarthria noted. There is no hypophonia. There is no lip, neck/head, jaw or voice tremor. Neck is supple with full range of passive and active motion. There are no carotid bruits on auscultation. Oropharynx exam reveals: mild mouth dryness, adequate dental hygiene and mild airway crowding, due to small airway entry.  She has a moderate overbite.  Tonsils are absent.  Mallampati class II.  Neck circumference 16 1/8 inches.   Chest: Clear to auscultation without wheezing, rhonchi or crackles noted.   Heart: S1+S2+0, regular and normal without murmurs, rubs or gallops noted.    Abdomen: Soft, non-tender and non-distended.   Extremities: There is no obvious edema.     Skin: Warm and dry without trophic changes noted.    Musculoskeletal: exam reveals no obvious joint deformities.    Neurologically:  Mental status: The patient is awake, alert and oriented in all 4 spheres. Her immediate and remote memory, attention, language skills and fund of knowledge are appropriate. There is no evidence of aphasia, agnosia, apraxia or anomia. Speech is clear with normal prosody and enunciation. Thought process is linear. Mood is normal and affect is normal.  Cranial nerves II - XII are as  described above under HEENT exam.  Motor exam: Normal bulk, and strength noted, no obvious resting or action tremor.  Fine motor skills and coordination: grossly intact.  Cerebellar testing: No dysmetria or intention tremor. There is no truncal or gait ataxia.  Sensory exam: intact to light touch in the upper and lower extremities.  Gait, station and balance: She stands easily. No veering to one side is noted. No leaning to one side is noted. Posture is age-appropriate and stance is narrow based. Gait shows normal stride length and normal pace. No problems turning are noted.    Assessment and Plan:  In summary, SYMARA BOCCUZZI is a very pleasant 77 year old female with an underlying medical history of hypertension, hyperlipidemia, thyroid disease, reflux disease, diabetes, chronic kidney disease, depression, heart murmur, and mild obesity, who presents for follow-up consultation of her obstructive sleep apnea.  She was originally diagnosed in 2012 and had another sleep study in [place] in 2017.  She is currently on her second machine which is about 75 years old and she should be eligible for new equipment.  Her machine makes an abnormally loud noise, particularly with exhalation she states and she would like to go ahead and pursue new machine.  She should be eligible at this time, we will reevaluate her sleep apnea with a home sleep test.  I explained the test procedure to the  patient in detail and also reminded her not to use her AutoPap the night of testing so we get diagnostic data, not treatment data.  We will proceed with a prescription for a new machine after the test and have her follow-up in sleep clinic within 1 to 3 months after set up with a new machine, she will likely want to have the newest model of the ResMed air sense machine.  She currently has an AirSense 10 for Her. She is compliant with treatment and has benefited from it over the course of years.  In fact, whenever she has skipped a  night here and there she reports that she does not sleep well and goes right back on treatment the next day.  She has had intermittent dizziness sometimes after using her CPAP but generally feels better with it than without it.  We will plan to follow-up according to her next test and send update.  Answered all her questions today and she was in agreement.   I spent 30 minutes in total face-to-face time and in reviewing records during pre-charting, more than 50% of which was spent in counseling and coordination of care, reviewing test results, reviewing medications and treatment regimen and/or in discussing or reviewing the diagnosis of OSA, the prognosis and treatment options. Pertinent laboratory and imaging test results that were available during this visit with the patient were reviewed by me and considered in my medical decision making (see chart for details).

## 2022-12-02 ENCOUNTER — Other Ambulatory Visit: Payer: Self-pay | Admitting: Emergency Medicine

## 2022-12-02 DIAGNOSIS — N1832 Chronic kidney disease, stage 3b: Secondary | ICD-10-CM

## 2022-12-12 ENCOUNTER — Ambulatory Visit
Admission: RE | Admit: 2022-12-12 | Discharge: 2022-12-12 | Disposition: A | Discharge status: Home / Self Care | Payer: Medicare Other | Source: Ambulatory Visit | Attending: Family Medicine | Admitting: Family Medicine

## 2022-12-12 DIAGNOSIS — Z1231 Encounter for screening mammogram for malignant neoplasm of breast: Secondary | ICD-10-CM

## 2022-12-27 ENCOUNTER — Ambulatory Visit (INDEPENDENT_AMBULATORY_CARE_PROVIDER_SITE_OTHER): Payer: Medicare Other | Admitting: Neurology

## 2022-12-27 DIAGNOSIS — Z789 Other specified health status: Secondary | ICD-10-CM

## 2022-12-27 DIAGNOSIS — G4733 Obstructive sleep apnea (adult) (pediatric): Secondary | ICD-10-CM

## 2022-12-29 NOTE — Addendum Note (Signed)
Addended by: Huston Foley on: 12/29/2022 04:23 PM   Modules accepted: Orders

## 2022-12-29 NOTE — Procedures (Signed)
[facility]  HOME SLEEP TEST (Watch PAT) REPORT  STUDY DATE: 12/27/2022  DOB: May 17, 1945  MRN: [id number]  ORDERING CLINICIAN: Huston Foley, MD, PhD   REFERRING CLINICIAN: Georgina Quint, MD   CLINICAL INFORMATION/HISTORY: 77 year old right-handed woman with an underlying medical history of hypertension, hyperlipidemia, thyroid disease, reflux disease, diabetes, chronic kidney disease, depression, heart murmur, and mild obesity, who presents for reevaluation of her obstructive sleep apnea.  She has a history of moderate obstructive sleep apnea and has an older AutoPap machine.  She is compliant with her AutoPap of 5 to 15 cm with EPR of 3, with good tolerance and good apnea control.  She should be eligible for a new machine.  Epworth sleepiness score: 9/24.  BMI: 32.1 kg/m  FINDINGS:   Sleep Summary:   Total Recording Time (hours, min): 7 hours, 48 min  Total Sleep Time (hours, min):  6 hours, 15 min  Percent REM (%):    15.3%   Respiratory Indices:   Calculated pAHI (per hour):  19.9/hour -utilizing the 4% desaturation criteria for hypopneas per Medicare guidelines.         REM pAHI:    55.2/hour       NREM pAHI: 24.2/hour  Central pAHI: 0.5/hour  Oxygen Saturation Statistics:    Oxygen Saturation (%) Mean: 92%   Minimum oxygen saturation (%):                 84%   O2 Saturation Range (%): 84-97%    O2 Saturation (minutes) <=88%: 1.6 min  Pulse Rate Statistics:   Pulse Mean (bpm):    62/min    Pulse Range (53-88/min)   IMPRESSION: OSA (obstructive sleep apnea), moderate  RECOMMENDATION:  This home sleep test demonstrates moderate obstructive sleep apnea with a total AHI of 28.9/hour (19.9/h, utilizing the 4% desaturation criteria for hypopneas per Medicare guidelines) and O2 nadir of 84%.  Moderate snoring was detected fairly consistently throughout the study.  Ongoing treatment with a positive airway pressure (PAP) device is  recommended. The patient has been compliant with AutoPap therapy.  She should be eligible for a new machine.  I will prescribe a new AutoPap machine with a minimum pressure of 5 cm, maximum pressure of 15 cm, EPR of 3.  Mask of choice, sized to fit, she is currently using nasal pillows with good success and tolerance.  A full night titration study may be considered to optimize treatment settings, monitor proper oxygen saturations and aid with improvement of tolerance and adherence, if needed down the road.  Alternative treatment options may include a dental device through dentistry or orthodontics in selected patients or Inspire (hypoglossal nerve stimulator) in carefully selected patients (meeting inclusion criteria).  Concomitant weight loss is recommended (where clinically appropriate). Please note that untreated obstructive sleep apnea may carry additional perioperative morbidity. Patients with significant obstructive sleep apnea should receive perioperative PAP therapy and the surgeons and particularly the anesthesiologist should be informed of the diagnosis and the severity of the sleep disordered breathing. The patient should be cautioned not to drive, work at heights, or operate dangerous or heavy equipment when tired or sleepy. Review and reiteration of good sleep hygiene measures should be pursued with any patient. Other causes of the patient's symptoms, including circadian rhythm disturbances, an underlying mood disorder, medication effect and/or an underlying medical problem cannot be ruled out based on this test. Clinical correlation is recommended.  The patient and her referring provider will be notified of the  test results. The patient will be seen in follow up in sleep clinic at [facility].  I certify that I have reviewed the raw data recording prior to the issuance of this report in accordance with the standards of the American Academy of Sleep Medicine (AASM).    INTERPRETING PHYSICIAN:    Huston Foley, MD, PhD Medical Director, [facility]) Diplomat, ABPN (Neurology and Sleep)   [facility] [address] [place], [place] [address] [phone]

## 2023-01-02 NOTE — Telephone Encounter (Addendum)
[name], CMA  Zott, Kennyth Arnold; Gamble, Tammy New orders have been placed for the above pt, DOB: [birth year] Thanks  Zott, Stacy  Marlene Pfluger, Abbe Amsterdam, CMA; Melvern Sample Got It Thank you

## 2023-01-19 ENCOUNTER — Other Ambulatory Visit: Payer: Self-pay | Admitting: Emergency Medicine

## 2023-02-13 DIAGNOSIS — I129 Hypertensive chronic kidney disease with stage 1 through stage 4 chronic kidney disease, or unspecified chronic kidney disease: Secondary | ICD-10-CM | POA: Diagnosis not present

## 2023-02-13 DIAGNOSIS — Z2821 Immunization not carried out because of patient refusal: Secondary | ICD-10-CM | POA: Diagnosis not present

## 2023-02-13 DIAGNOSIS — E1122 Type 2 diabetes mellitus with diabetic chronic kidney disease: Secondary | ICD-10-CM | POA: Diagnosis not present

## 2023-02-13 DIAGNOSIS — Z6831 Body mass index (BMI) 31.0-31.9, adult: Secondary | ICD-10-CM | POA: Diagnosis not present

## 2023-02-13 DIAGNOSIS — E039 Hypothyroidism, unspecified: Secondary | ICD-10-CM | POA: Diagnosis not present

## 2023-02-13 DIAGNOSIS — I10 Essential (primary) hypertension: Secondary | ICD-10-CM | POA: Diagnosis not present

## 2023-02-13 DIAGNOSIS — E785 Hyperlipidemia, unspecified: Secondary | ICD-10-CM | POA: Diagnosis not present

## 2023-02-13 DIAGNOSIS — N185 Chronic kidney disease, stage 5: Secondary | ICD-10-CM | POA: Diagnosis not present

## 2023-02-13 DIAGNOSIS — G4730 Sleep apnea, unspecified: Secondary | ICD-10-CM | POA: Diagnosis not present

## 2023-02-13 DIAGNOSIS — E1169 Type 2 diabetes mellitus with other specified complication: Secondary | ICD-10-CM | POA: Diagnosis not present

## 2023-02-19 ENCOUNTER — Other Ambulatory Visit: Payer: Self-pay | Admitting: Emergency Medicine

## 2023-02-19 DIAGNOSIS — E1169 Type 2 diabetes mellitus with other specified complication: Secondary | ICD-10-CM

## 2023-02-28 ENCOUNTER — Other Ambulatory Visit: Payer: Self-pay | Admitting: Emergency Medicine

## 2023-02-28 DIAGNOSIS — N1832 Chronic kidney disease, stage 3b: Secondary | ICD-10-CM

## 2023-03-20 DIAGNOSIS — N184 Chronic kidney disease, stage 4 (severe): Secondary | ICD-10-CM | POA: Diagnosis not present

## 2023-03-20 DIAGNOSIS — N281 Cyst of kidney, acquired: Secondary | ICD-10-CM | POA: Diagnosis not present

## 2023-03-20 DIAGNOSIS — I129 Hypertensive chronic kidney disease with stage 1 through stage 4 chronic kidney disease, or unspecified chronic kidney disease: Secondary | ICD-10-CM | POA: Diagnosis not present

## 2023-03-20 DIAGNOSIS — E1122 Type 2 diabetes mellitus with diabetic chronic kidney disease: Secondary | ICD-10-CM | POA: Diagnosis not present

## 2023-03-20 DIAGNOSIS — D649 Anemia, unspecified: Secondary | ICD-10-CM | POA: Diagnosis not present

## 2023-03-22 NOTE — Progress Notes (Signed)
[facility] [address] [place]. [place] [address] [phone]       OFFICE FOLLOW UP NOTE  [name] Date of Birth:  07-14-1945 Medical Record Number:  [id number]    Primary neurologist: Dr. Frances Furbish Reason for visit: Initial CPAP follow-up    SUBJECTIVE:   CHIEF COMPLAINT:  Chief Complaint  Patient presents with   Results    Pt in 3 Pt here for cpap f/u  Pt states  can hear cpap breathe with her during the night Pt states annoying     Follow-up visit:  Prior visit: 11/30/2022 Dr. Frances Furbish  Brief HPI:   Megan Gentry is a 77 y.o. female who is followed for OSA on CPAP.  HST 12/2022 showed moderate OSA with total AHI 28.9/h and O2 nadir of 84%.  Recommended ongoing treatment with CPAP.  Received new CPAP machine on 01/18/2023.     Interval history:  Compliance report as below shows excellent usage and optimal residual AHI.  Reports noise from machine can be bothersome like the machine is breathing with her, she did not experience this with her other machines except for her prior machine which led her to get a new machine (although she was also overdue to new machine). She has not yet discussed this concern with DME.  She is otherwise tolerating well.  ESS 3/24.  Followed by DME [facility].              ROS:   14 system review of systems performed and negative with exception of those listed in HPI  PMH:  Past Medical History:  Diagnosis Date   Chronic kidney disease    Depression    Diabetes mellitus without complication (HCC)    Genital herpes    GERD (gastroesophageal reflux disease)    Heart murmur    Hyperlipidemia    Hypertension    Thyroid disease     PSH: History reviewed. No pertinent surgical history.  Social History:  Social History   Socioeconomic History   Marital status: Divorced    Spouse name: Not on file   Number of children: Not on file   Years of education: Not on file   Highest education level:  Not on file  Occupational History   Not on file  Tobacco Use   Smoking status: Former    Types: Cigarettes   Smokeless tobacco: Never  Substance and Sexual Activity   Alcohol use: Yes    Comment: occ glass of wine   Drug use: Never   Sexual activity: Not Currently  Other Topics Concern   Not on file  Social History Narrative   Pt retired   Lives with sister    Social Determinants of Health   Financial Resource Strain: Low Risk  (03/08/2022)   Overall Financial Resource Strain (CARDIA)    Difficulty of Paying Living Expenses: Not hard at all  Food Insecurity: No Food Insecurity (03/08/2022)   Hunger Vital Sign    Worried About Running Out of Food in the Last Year: Never true    Ran Out of Food in the Last Year: Never true  Transportation Needs: No Transportation Needs (03/08/2022)   PRAPARE - Administrator, Civil Service (Medical): No    Lack of Transportation (Non-Medical): No  Physical Activity: Sufficiently Active (03/08/2022)   Exercise Vital Sign    Days of Exercise per Week: 7 days    Minutes of Exercise per Session: 30 min  Stress: No  Stress Concern Present (03/08/2022)   [employer] of Occupational Health - Occupational Stress Questionnaire    Feeling of Stress : Not at all  Social Connections: Socially Integrated (03/08/2022)   Social Connection and Isolation Panel [NHANES]    Frequency of Communication with Friends and Family: More than three times a week    Frequency of Social Gatherings with Friends and Family: More than three times a week    Attends Religious Services: More than 4 times per year    Active Member of [employer] or Organizations: Yes    Attends Engineer, structural: More than 4 times per year    Marital Status: Married  Catering manager Violence: Not At Risk (03/08/2022)   Humiliation, Afraid, Rape, and Kick questionnaire    Fear of Current or Ex-Partner: No    Emotionally Abused: No    Physically Abused: No     Sexually Abused: No    Family History:  Family History  Problem Relation Age of Onset   Stroke Mother    Hypertension Mother    Heart disease Mother    Heart disease Father    Alcohol abuse Father    Intellectual disability Sister    Hypertension Sister    Heart disease Sister    Hearing loss Sister    Hypertension Brother    Stroke Brother    Diabetes Brother    Arthritis Brother    Stroke Brother    Hypertension Brother    Heart disease Maternal Grandfather    Miscarriages / Stillbirths Daughter    Asthma Daughter    Heart disease Son    Sleep apnea Neg Hx     Medications:   Current Outpatient Medications on File Prior to Visit  Medication Sig Dispense Refill   amLODipine (NORVASC) 10 MG tablet TAKE 1 TABLET EVERY DAY 90 tablet 3   Cholecalciferol (VITAMIN D3) 50 MCG (2000 UT) capsule Take 2,000 Units by mouth daily.     fluticasone (FLONASE) 50 MCG/ACT nasal spray      glipiZIDE (GLUCOTROL) 5 MG tablet TAKE 1 TABLET BY MOUTH ONCE DAILY BEFORE BREAKFAST 90 tablet 0   hydrALAZINE (APRESOLINE) 50 MG tablet TAKE 1 TABLET TWICE DAILY 180 tablet 3   latanoprost (XALATAN) 0.005 % ophthalmic solution      levothyroxine (SYNTHROID) 75 MCG tablet TAKE 1 TABLET EVERY DAY BEFORE BREAKFAST 90 tablet 3   metoprolol succinate (TOPROL-XL) 100 MG 24 hr tablet TAKE 1 TABLET EVERY DAY 90 tablet 3   Multiple Vitamin (MULTIVITAMIN) capsule Take 1 capsule by mouth daily.     olmesartan-hydrochlorothiazide (BENICAR HCT) 40-25 MG tablet Take 1 tablet by mouth daily.     pramoxine-hydrocortisone (PROCTOCREAM-HC) 1-1 % rectal cream Place 1 application rectally 2 (two) times daily. 2.5%     simvastatin (ZOCOR) 40 MG tablet TAKE 1 TABLET EVERY DAY 90 tablet 3   valACYclovir (VALTREX) 1000 MG tablet Take 1,000 mg by mouth 2 (two) times daily. prn     vitamin E 180 MG (400 UNITS) capsule Take 400 Units by mouth daily.     olmesartan-hydrochlorothiazide (BENICAR HCT) 20-12.5 MG tablet Take 1 tablet  by mouth daily. (Patient taking differently: Take 1 tablet by mouth daily. 20-25 mg tablet daily) 90 tablet 3   spironolactone (ALDACTONE) 25 MG tablet Take 1 tablet (25 mg total) by mouth daily. 90 tablet 3   No current facility-administered medications on file prior to visit.    Allergies:   Allergies  Allergen Reactions  Pyridium [Phenazopyridine Hcl] Other (See Comments)    Severe lower back pain    Poison Ivy Extract Other (See Comments)    Swelling of face and eyes      OBJECTIVE:  Physical Exam  Vitals:   03/27/23 1241  BP: 136/64  Pulse: 65  Weight: 206 lb 3.2 oz (93.5 kg)  Height: 5\' 8"  (1.727 m)   Body mass index is 31.35 kg/m. No results found.  General: well developed, well nourished, seated, in no evident distress  Neurologic Exam Mental Status: Awake and fully alert. Oriented to place and time. Recent and remote memory intact. Attention span, concentration and fund of knowledge appropriate. Mood and affect appropriate.  Cranial Nerves: Pupils equal, briskly reactive to light. Extraocular movements full without nystagmus. Visual fields full to confrontation. Hearing intact. Facial sensation intact. Face, tongue, palate moves normally and symmetrically.  Motor: Normal bulk and tone. Normal strength in all tested extremity muscles Gait and Station: Arises from chair without difficulty. Stance is normal. Gait demonstrates normal stride length and balance without use of AD. Tandem walk and heel toe without difficulty.  Reflexes: 1+ and symmetric. Toes downgoing.         ASSESSMENT/PLAN: Megan Gentry is a 77 y.o. year old female    OSA on CPAP : Compliance report shows satisfactory usage with optimal residual AHI.  Continue current pressure settings of 5-15 with EPR level 3. Advised to f/u with DME regarding noise coming from machine although suspect this is normal and will just take some time to get used to.  Discussed continued nightly usage with  ensuring greater than 4 hours nightly for optimal benefit and per insurance purposes.  Continue to follow with DME company for any needed supplies or CPAP related concerns     Follow up in 1 year in office visit (declines VV) or call earlier if needed   CC:  PCP: Renaye Rakers, MD    I spent 25 minutes of face-to-face and non-face-to-face time with patient.  This included previsit chart review, lab review, study review, order entry, electronic health record documentation, patient education and discussion regarding above diagnoses and treatment plan and answered all other questions to patient's satisfaction  Ihor Austin, [facility]  [facility] [address] [place], [place] [address]  Phone [phone] Fax [phone] Note: This document was prepared with digital dictation and possible smart phrase technology. Any transcriptional errors that result from this process are unintentional.

## 2023-03-27 ENCOUNTER — Ambulatory Visit (INDEPENDENT_AMBULATORY_CARE_PROVIDER_SITE_OTHER): Payer: Medicare Other | Admitting: Adult Health

## 2023-03-27 ENCOUNTER — Encounter: Payer: Self-pay | Admitting: Adult Health

## 2023-03-27 ENCOUNTER — Telehealth: Payer: Self-pay

## 2023-03-27 VITALS — BP 136/64 | HR 65 | Ht 68.0 in | Wt 206.2 lb

## 2023-03-27 DIAGNOSIS — G4733 Obstructive sleep apnea (adult) (pediatric): Secondary | ICD-10-CM

## 2023-03-27 NOTE — Patient Instructions (Addendum)
Your Plan:  Continue nightly CPAP use for adequate sleep apnea management  Continue to follow with DME company [facility] for any needed supplies or CPAP related concerns     Follow up in 1 year or call earlier if needed      Thank you for coming to see [place] at [facility]. I hope we have been able to provide you high quality care today.  You may receive a patient satisfaction survey over the next few weeks. We would appreciate your feedback and comments so that we may continue to improve ourselves and the health of our patients.

## 2023-05-23 DIAGNOSIS — H2513 Age-related nuclear cataract, bilateral: Secondary | ICD-10-CM | POA: Diagnosis not present

## 2023-05-23 DIAGNOSIS — H401131 Primary open-angle glaucoma, bilateral, mild stage: Secondary | ICD-10-CM | POA: Diagnosis not present

## 2023-05-29 ENCOUNTER — Other Ambulatory Visit: Payer: Self-pay | Admitting: Emergency Medicine

## 2023-05-29 DIAGNOSIS — E1122 Type 2 diabetes mellitus with diabetic chronic kidney disease: Secondary | ICD-10-CM

## 2023-06-10 ENCOUNTER — Other Ambulatory Visit: Payer: Self-pay | Admitting: Emergency Medicine

## 2023-06-10 DIAGNOSIS — E1122 Type 2 diabetes mellitus with diabetic chronic kidney disease: Secondary | ICD-10-CM

## 2023-06-13 ENCOUNTER — Other Ambulatory Visit: Payer: Self-pay | Admitting: Emergency Medicine

## 2023-06-13 DIAGNOSIS — N1832 Chronic kidney disease, stage 3b: Secondary | ICD-10-CM

## 2023-06-16 DIAGNOSIS — E039 Hypothyroidism, unspecified: Secondary | ICD-10-CM | POA: Diagnosis not present

## 2023-06-16 DIAGNOSIS — I129 Hypertensive chronic kidney disease with stage 1 through stage 4 chronic kidney disease, or unspecified chronic kidney disease: Secondary | ICD-10-CM | POA: Diagnosis not present

## 2023-06-16 DIAGNOSIS — N185 Chronic kidney disease, stage 5: Secondary | ICD-10-CM | POA: Diagnosis not present

## 2023-06-16 DIAGNOSIS — E785 Hyperlipidemia, unspecified: Secondary | ICD-10-CM | POA: Diagnosis not present

## 2023-06-16 DIAGNOSIS — E1169 Type 2 diabetes mellitus with other specified complication: Secondary | ICD-10-CM | POA: Diagnosis not present

## 2023-06-16 DIAGNOSIS — G4730 Sleep apnea, unspecified: Secondary | ICD-10-CM | POA: Diagnosis not present

## 2023-07-28 DIAGNOSIS — E039 Hypothyroidism, unspecified: Secondary | ICD-10-CM | POA: Diagnosis not present

## 2023-07-28 DIAGNOSIS — I129 Hypertensive chronic kidney disease with stage 1 through stage 4 chronic kidney disease, or unspecified chronic kidney disease: Secondary | ICD-10-CM | POA: Diagnosis not present

## 2023-07-28 DIAGNOSIS — E785 Hyperlipidemia, unspecified: Secondary | ICD-10-CM | POA: Diagnosis not present

## 2023-07-28 DIAGNOSIS — G4730 Sleep apnea, unspecified: Secondary | ICD-10-CM | POA: Diagnosis not present

## 2023-07-28 DIAGNOSIS — N189 Chronic kidney disease, unspecified: Secondary | ICD-10-CM | POA: Diagnosis not present

## 2023-09-22 DIAGNOSIS — N184 Chronic kidney disease, stage 4 (severe): Secondary | ICD-10-CM | POA: Diagnosis not present

## 2023-09-22 DIAGNOSIS — D649 Anemia, unspecified: Secondary | ICD-10-CM | POA: Diagnosis not present

## 2023-09-22 DIAGNOSIS — I129 Hypertensive chronic kidney disease with stage 1 through stage 4 chronic kidney disease, or unspecified chronic kidney disease: Secondary | ICD-10-CM | POA: Diagnosis not present

## 2023-09-22 DIAGNOSIS — N281 Cyst of kidney, acquired: Secondary | ICD-10-CM | POA: Diagnosis not present

## 2023-09-22 DIAGNOSIS — E1122 Type 2 diabetes mellitus with diabetic chronic kidney disease: Secondary | ICD-10-CM | POA: Diagnosis not present

## 2023-09-29 DIAGNOSIS — H10413 Chronic giant papillary conjunctivitis, bilateral: Secondary | ICD-10-CM | POA: Diagnosis not present

## 2023-10-16 ENCOUNTER — Other Ambulatory Visit: Payer: Self-pay | Admitting: Emergency Medicine

## 2023-10-16 DIAGNOSIS — E039 Hypothyroidism, unspecified: Secondary | ICD-10-CM

## 2023-11-10 ENCOUNTER — Other Ambulatory Visit: Payer: Self-pay | Admitting: Family Medicine

## 2023-11-10 DIAGNOSIS — Z1231 Encounter for screening mammogram for malignant neoplasm of breast: Secondary | ICD-10-CM

## 2023-11-16 DIAGNOSIS — H25813 Combined forms of age-related cataract, bilateral: Secondary | ICD-10-CM | POA: Diagnosis not present

## 2023-11-16 DIAGNOSIS — H401131 Primary open-angle glaucoma, bilateral, mild stage: Secondary | ICD-10-CM | POA: Diagnosis not present

## 2023-11-27 DIAGNOSIS — E039 Hypothyroidism, unspecified: Secondary | ICD-10-CM | POA: Diagnosis not present

## 2023-11-27 DIAGNOSIS — Z0000 Encounter for general adult medical examination without abnormal findings: Secondary | ICD-10-CM | POA: Diagnosis not present

## 2023-11-27 DIAGNOSIS — F4381 Prolonged grief disorder: Secondary | ICD-10-CM | POA: Diagnosis not present

## 2023-11-27 DIAGNOSIS — G4730 Sleep apnea, unspecified: Secondary | ICD-10-CM | POA: Diagnosis not present

## 2023-11-27 DIAGNOSIS — E1122 Type 2 diabetes mellitus with diabetic chronic kidney disease: Secondary | ICD-10-CM | POA: Diagnosis not present

## 2023-11-27 DIAGNOSIS — N189 Chronic kidney disease, unspecified: Secondary | ICD-10-CM | POA: Diagnosis not present

## 2023-11-27 DIAGNOSIS — I129 Hypertensive chronic kidney disease with stage 1 through stage 4 chronic kidney disease, or unspecified chronic kidney disease: Secondary | ICD-10-CM | POA: Diagnosis not present

## 2023-12-13 ENCOUNTER — Ambulatory Visit: Payer: PRIVATE HEALTH INSURANCE

## 2023-12-15 ENCOUNTER — Ambulatory Visit
Admission: RE | Admit: 2023-12-15 | Discharge: 2023-12-15 | Disposition: A | Discharge status: Home / Self Care | Source: Ambulatory Visit | Attending: Family Medicine | Admitting: Family Medicine

## 2023-12-15 DIAGNOSIS — Z1231 Encounter for screening mammogram for malignant neoplasm of breast: Secondary | ICD-10-CM

## 2024-01-08 DIAGNOSIS — N189 Chronic kidney disease, unspecified: Secondary | ICD-10-CM | POA: Diagnosis not present

## 2024-01-08 DIAGNOSIS — I10 Essential (primary) hypertension: Secondary | ICD-10-CM | POA: Diagnosis not present

## 2024-01-08 DIAGNOSIS — E039 Hypothyroidism, unspecified: Secondary | ICD-10-CM | POA: Diagnosis not present

## 2024-01-08 DIAGNOSIS — I129 Hypertensive chronic kidney disease with stage 1 through stage 4 chronic kidney disease, or unspecified chronic kidney disease: Secondary | ICD-10-CM | POA: Diagnosis not present

## 2024-01-08 DIAGNOSIS — E785 Hyperlipidemia, unspecified: Secondary | ICD-10-CM | POA: Diagnosis not present

## 2024-01-08 DIAGNOSIS — G4730 Sleep apnea, unspecified: Secondary | ICD-10-CM | POA: Diagnosis not present

## 2024-01-08 DIAGNOSIS — E1122 Type 2 diabetes mellitus with diabetic chronic kidney disease: Secondary | ICD-10-CM | POA: Diagnosis not present

## 2024-01-08 DIAGNOSIS — Z23 Encounter for immunization: Secondary | ICD-10-CM | POA: Diagnosis not present

## 2024-02-10 ENCOUNTER — Other Ambulatory Visit: Payer: Self-pay | Admitting: Emergency Medicine

## 2024-02-10 DIAGNOSIS — E1169 Type 2 diabetes mellitus with other specified complication: Secondary | ICD-10-CM

## 2024-03-05 DIAGNOSIS — N184 Chronic kidney disease, stage 4 (severe): Secondary | ICD-10-CM | POA: Diagnosis not present

## 2024-03-11 ENCOUNTER — Other Ambulatory Visit: Payer: Self-pay | Admitting: Emergency Medicine

## 2024-03-12 DIAGNOSIS — E1122 Type 2 diabetes mellitus with diabetic chronic kidney disease: Secondary | ICD-10-CM | POA: Diagnosis not present

## 2024-03-12 DIAGNOSIS — D649 Anemia, unspecified: Secondary | ICD-10-CM | POA: Diagnosis not present

## 2024-03-12 DIAGNOSIS — N184 Chronic kidney disease, stage 4 (severe): Secondary | ICD-10-CM | POA: Diagnosis not present

## 2024-03-12 DIAGNOSIS — N281 Cyst of kidney, acquired: Secondary | ICD-10-CM | POA: Diagnosis not present

## 2024-03-12 DIAGNOSIS — I129 Hypertensive chronic kidney disease with stage 1 through stage 4 chronic kidney disease, or unspecified chronic kidney disease: Secondary | ICD-10-CM | POA: Diagnosis not present

## 2024-03-27 ENCOUNTER — Ambulatory Visit: Payer: Medicare Other | Admitting: Adult Health

## 2024-03-27 ENCOUNTER — Encounter: Payer: Self-pay | Admitting: Adult Health

## 2024-03-27 VITALS — BP 129/55 | HR 60 | Ht 67.0 in | Wt 200.2 lb

## 2024-03-27 DIAGNOSIS — G4733 Obstructive sleep apnea (adult) (pediatric): Secondary | ICD-10-CM | POA: Diagnosis not present

## 2024-03-27 NOTE — Progress Notes (Signed)
 [facility] [address] [place]. [place] [address] [phone]       OFFICE FOLLOW UP NOTE  [name] Date of Birth:  28-Nov-1945 Medical Record Number:  [id number]    Primary neurologist: Dr. Buck Reason for visit: CPAP follow-up    SUBJECTIVE:   CHIEF COMPLAINT:  Chief Complaint  Patient presents with   Follow-up    Pt in 3 alone Pt here for cpap f/u Pt states no questions or concerns for today's  visit      Follow-up visit:  Prior visit: 03/27/2023  Brief HPI:   Megan Gentry is a 78 y.o. female who is followed for OSA on CPAP.  HST 12/2022 showed moderate OSA with total AHI 28.9/h and O2 nadir of 84%.  Recommended ongoing treatment with CPAP.  Received new CPAP machine on 01/18/2023.     Interval history:  Patient returns for yearly CPAP compliance visit.  Compliance report as below noting excellent usage and optimal residual AHI.  Continues to do well with CPAP therapy.  Tolerating AirFit P10 mask without difficulty.  Notes continued benefit with CPAP treatment.  ESS 6/24. Followed by DME [facility] and up to date on supplies.              ROS:   14 system review of systems performed and negative with exception of those listed in HPI  PMH:  Past Medical History:  Diagnosis Date   Chronic kidney disease    Depression    Diabetes mellitus without complication (HCC)    Genital herpes    GERD (gastroesophageal reflux disease)    Heart murmur    Hyperlipidemia    Hypertension    Thyroid  disease     PSH: History reviewed. No pertinent surgical history.  Social History:  Social History   Socioeconomic History   Marital status: Divorced    Spouse name: Not on file   Number of children: Not on file   Years of education: Not on file   Highest education level: Not on file  Occupational History   Not on file  Tobacco Use   Smoking status: Former    Types: Cigarettes   Smokeless tobacco: Never  Vaping Use    Vaping status: Never Used  Substance and Sexual Activity   Alcohol use: Yes    Comment: occ glass of wine   Drug use: Never   Sexual activity: Not Currently  Other Topics Concern   Not on file  Social History Narrative   Pt retired   Lives with sister    Social Drivers of Corporate Investment Banker Strain: Low Risk  (03/08/2022)   Overall Financial Resource Strain (CARDIA)    Difficulty of Paying Living Expenses: Not hard at all  Food Insecurity: No Food Insecurity (03/08/2022)   Hunger Vital Sign    Worried About Running Out of Food in the Last Year: Never true    Ran Out of Food in the Last Year: Never true  Transportation Needs: No Transportation Needs (03/08/2022)   PRAPARE - Administrator, Civil Service (Medical): No    Lack of Transportation (Non-Medical): No  Physical Activity: Sufficiently Active (03/08/2022)   Exercise Vital Sign    Days of Exercise per Week: 7 days    Minutes of Exercise per Session: 30 min  Stress: No Stress Concern Present (03/08/2022)   [employer] of Occupational Health - Occupational Stress Questionnaire    Feeling of Stress : Not  at all  Social Connections: Socially Integrated (03/08/2022)   Social Connection and Isolation Panel    Frequency of Communication with Friends and Family: More than three times a week    Frequency of Social Gatherings with Friends and Family: More than three times a week    Attends Religious Services: More than 4 times per year    Active Member of [employer] or Organizations: Yes    Attends Engineer, Structural: More than 4 times per year    Marital Status: Married  Catering Manager Violence: Not At Risk (03/08/2022)   Humiliation, Afraid, Rape, and Kick questionnaire    Fear of Current or Ex-Partner: No    Emotionally Abused: No    Physically Abused: No    Sexually Abused: No    Family History:  Family History  Problem Relation Age of Onset   Stroke Mother    Hypertension Mother     Heart disease Mother    Heart disease Father    Alcohol abuse Father    Intellectual disability Sister    Hypertension Sister    Heart disease Sister    Hearing loss Sister    Hypertension Brother    Stroke Brother    Diabetes Brother    Arthritis Brother    Stroke Brother    Hypertension Brother    Heart disease Maternal Grandfather    Miscarriages / Stillbirths Daughter    Asthma Daughter    Heart disease Son    Sleep apnea Neg Hx     Medications:   Current Outpatient Medications on File Prior to Visit  Medication Sig Dispense Refill   amLODipine  (NORVASC ) 10 MG tablet TAKE 1 TABLET EVERY DAY 90 tablet 3   Cholecalciferol (VITAMIN D3) 50 MCG (2000 UT) capsule Take 2,000 Units by mouth daily.     fluticasone (FLONASE) 50 MCG/ACT nasal spray      glipiZIDE  (GLUCOTROL ) 5 MG tablet TAKE 1 TABLET BY MOUTH ONCE DAILY BEFORE BREAKFAST 90 tablet 0   hydrALAZINE  (APRESOLINE ) 50 MG tablet TAKE 1 TABLET TWICE DAILY 180 tablet 3   latanoprost (XALATAN) 0.005 % ophthalmic solution      levothyroxine  (SYNTHROID ) 75 MCG tablet TAKE 1 TABLET EVERY DAY BEFORE BREAKFAST 90 tablet 3   metoprolol  succinate (TOPROL -XL) 100 MG 24 hr tablet TAKE 1 TABLET EVERY DAY 90 tablet 3   Multiple Vitamin (MULTIVITAMIN) capsule Take 1 capsule by mouth daily.     olmesartan -hydrochlorothiazide (BENICAR  HCT) 20-12.5 MG tablet Take 1 tablet by mouth daily. (Patient taking differently: Take 1 tablet by mouth daily. 4-25 tablet daily) 90 tablet 3   olmesartan -hydrochlorothiazide (BENICAR  HCT) 40-25 MG tablet Take 1 tablet by mouth daily.     pramoxine-hydrocortisone (PROCTOCREAM-HC) 1-1 % rectal cream Place 1 application rectally 2 (two) times daily. 2.5%     simvastatin  (ZOCOR ) 40 MG tablet TAKE 1 TABLET EVERY DAY 90 tablet 3   valACYclovir (VALTREX) 1000 MG tablet Take 1,000 mg by mouth 2 (two) times daily. prn     vitamin E 180 MG (400 UNITS) capsule Take 400 Units by mouth daily.     No current  facility-administered medications on file prior to visit.    Allergies:   Allergies  Allergen Reactions   Pyridium [Phenazopyridine Hcl] Other (See Comments)    Severe lower back pain    Poison Ivy Extract Other (See Comments)    Swelling of face and eyes      OBJECTIVE:  Physical Exam  Vitals:  03/27/24 1311  BP: (!) 129/55  Pulse: 60  Weight: 200 lb 3.2 oz (90.8 kg)  Height: 5' 7 (1.702 m)    Body mass index is 31.36 kg/m. No results found.  General: well developed, well nourished, seated, in no evident distress  Neurologic Exam Mental Status: Awake and fully alert. Oriented to place and time. Recent and remote memory intact. Attention span, concentration and fund of knowledge appropriate. Mood and affect appropriate.  Cranial Nerves: Pupils equal, briskly reactive to light. Extraocular movements full without nystagmus. Visual fields full to confrontation. Hearing intact. Facial sensation intact. Face, tongue, palate moves normally and symmetrically.  Motor: Normal bulk and tone. Normal strength in all tested extremity muscles Gait and Station: Arises from chair without difficulty. Stance is normal. Gait demonstrates normal stride length and balance without use of AD. Tandem walk and heel toe without difficulty.          ASSESSMENT/PLAN: Megan Gentry is a 78 y.o. year old female    OSA on CPAP :  Compliance report shows satisfactory usage with optimal residual AHI.   Continue current pressure settings of 5-15 with EPR level 3.  Discussed continued nightly usage with ensuring greater than 4 hours nightly for optimal benefit and per insurance purposes.   Continue to follow with DME company [facility] for any needed supplies or CPAP related concerns CPAP set up 12/2022     Follow up in 1 year in office visit or call earlier if needed   CC:  PCP: Benjamine Aland, MD      Harlene Bogaert, AGNP-BC  [facility] [address]  [address] [place], [place] [address]  Phone [phone] Fax [phone] Note: This document was prepared with digital dictation and possible smart phrase technology. Any transcriptional errors that result from this process are unintentional.

## 2025-04-02 ENCOUNTER — Ambulatory Visit: Admitting: Adult Health
